# Patient Record
Sex: Female | Born: 1955 | Race: Asian | Hispanic: No | Marital: Single | Smoking: Never smoker
Health system: Southern US, Community
[De-identification: ages and names within clinical notes are randomized; demographics above are authoritative.]

## PROBLEM LIST (undated history)

## (undated) DIAGNOSIS — E7800 Pure hypercholesterolemia, unspecified: Secondary | ICD-10-CM

## (undated) DIAGNOSIS — M810 Age-related osteoporosis without current pathological fracture: Secondary | ICD-10-CM

## (undated) DIAGNOSIS — K056 Periodontal disease, unspecified: Secondary | ICD-10-CM

## (undated) DIAGNOSIS — Z809 Family history of malignant neoplasm, unspecified: Secondary | ICD-10-CM

## (undated) DIAGNOSIS — M7550 Bursitis of unspecified shoulder: Secondary | ICD-10-CM

## (undated) DIAGNOSIS — G43909 Migraine, unspecified, not intractable, without status migrainosus: Secondary | ICD-10-CM

## (undated) DIAGNOSIS — Z803 Family history of malignant neoplasm of breast: Secondary | ICD-10-CM

## (undated) HISTORY — DX: Bursitis of unspecified shoulder: M75.50

## (undated) HISTORY — DX: Age-related osteoporosis without current pathological fracture: M81.0

## (undated) HISTORY — DX: Family history of malignant neoplasm, unspecified: Z80.9

## (undated) HISTORY — DX: Periodontal disease, unspecified: K05.6

## (undated) HISTORY — DX: Family history of malignant neoplasm of breast: Z80.3

## (undated) HISTORY — DX: Pure hypercholesterolemia, unspecified: E78.00

## (undated) HISTORY — DX: Migraine, unspecified, not intractable, without status migrainosus: G43.909

---

## 2015-03-03 ENCOUNTER — Other Ambulatory Visit: Payer: Self-pay | Admitting: Internal Medicine

## 2015-03-03 ENCOUNTER — Encounter: Payer: Self-pay | Admitting: Internal Medicine

## 2015-03-03 DIAGNOSIS — Z1231 Encounter for screening mammogram for malignant neoplasm of breast: Secondary | ICD-10-CM

## 2015-03-04 ENCOUNTER — Telehealth: Payer: Self-pay | Admitting: Obstetrics & Gynecology

## 2015-03-04 NOTE — Telephone Encounter (Signed)
Called and left a message for patient to call back to schedule a new patient doctor referral. °

## 2015-03-22 ENCOUNTER — Encounter (INDEPENDENT_AMBULATORY_CARE_PROVIDER_SITE_OTHER): Payer: Self-pay

## 2015-03-22 ENCOUNTER — Ambulatory Visit
Admission: RE | Admit: 2015-03-22 | Discharge: 2015-03-22 | Disposition: A | Discharge status: Home / Self Care | Payer: 59 | Source: Ambulatory Visit | Attending: Internal Medicine | Admitting: Internal Medicine

## 2015-03-22 DIAGNOSIS — Z1231 Encounter for screening mammogram for malignant neoplasm of breast: Secondary | ICD-10-CM

## 2015-03-24 ENCOUNTER — Ambulatory Visit (INDEPENDENT_AMBULATORY_CARE_PROVIDER_SITE_OTHER): Payer: 59 | Admitting: Obstetrics and Gynecology

## 2015-03-24 ENCOUNTER — Encounter: Payer: Self-pay | Admitting: Obstetrics and Gynecology

## 2015-03-24 VITALS — BP 100/66 | HR 70 | Resp 14 | Ht 64.0 in | Wt 117.0 lb

## 2015-03-24 DIAGNOSIS — Z0000 Encounter for general adult medical examination without abnormal findings: Secondary | ICD-10-CM | POA: Diagnosis not present

## 2015-03-24 DIAGNOSIS — Z01419 Encounter for gynecological examination (general) (routine) without abnormal findings: Secondary | ICD-10-CM

## 2015-03-24 LAB — POCT URINALYSIS DIPSTICK
BILIRUBIN UA: NEGATIVE
Blood, UA: NEGATIVE
GLUCOSE UA: NEGATIVE
KETONES UA: NEGATIVE
LEUKOCYTES UA: NEGATIVE
Nitrite, UA: NEGATIVE
Protein, UA: NEGATIVE
Urobilinogen, UA: NEGATIVE
pH, UA: 5

## 2015-03-24 NOTE — Progress Notes (Signed)
Patient ID: Ashley Estes, female   DOB: 05/30/1956, 59 y.o.   MRN: [id number] 59 y.o. G1P1001 Single Asian female here for annual exam.   No concerns.  Just here for a routine exam.   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  No bleeding or spotting.  No prior HRT. Taking Aledronate for osteoporosis.   Patient is upset about the time she is in the office today.  She did not realize that her information needed to be abstracted and that extra time was required for her first visit.  She did not eat today is is feeling shaky.   PCP: Domenick Gong, MD    Patient's last menstrual period was 11/20/2006 (approximate).          Sexually active: No. female The current method of family planning is post menopausal status.    Exercising: No.  none. Smoker:  no  Health Maintenance: Pap:  2014 wnl:neg HR HPV per patient History of abnormal Pap:  no MMG:  03-22-15 heterogeneously dense/nl:[facility] Colonoscopy:  NEVER.Marland Kitchen Declines.   Did stool card kit in January and it was negative.  BMD:   2013 in [place]  Result  Osteoporosis TDaP:  Within last 5 years Screening Labs:  Hb today: PCP, Urine today: Neg   reports that she has never smoked. She does not have any smokeless tobacco history on file. She reports that she does not drink alcohol or use illicit drugs.  Past Medical History  Diagnosis Date  . Osteoporosis   . Migraines     History reviewed. No pertinent past surgical history.  Current Outpatient Prescriptions  Medication Sig Dispense Refill  . alendronate (FOSAMAX) 70 MG tablet Take 1 tablet by mouth once a week.  0  . aspirin-acetaminophen-caffeine (EXCEDRIN MIGRAINE) 409-811-91 MG per tablet Take 2 tablets by mouth every 6 (six) hours as needed for headache.    . calcium carbonate (OS-CAL) 600 MG TABS tablet Take 600 mg by mouth daily.    . Fish Oil-Cholecalciferol (FISH OIL + D3) 1200-1000 MG-UNIT CAPS Take 1 tablet by mouth daily.    . Multiple  Vitamins-Minerals (MULTIVITAMIN PO) Take 1 tablet by mouth daily.    . rizatriptan (MAXALT) 10 MG tablet Take 1 tablet by mouth as needed.     No current facility-administered medications for this visit.    Family History  Problem Relation Age of Onset  . Cancer Mother     Dec from lung Ca age 51  . Breast cancer Mother 14  . Thyroid disease Mother   . Diabetes Father   . Cancer Father 86    dec from complications of diabetes  . Migraines Sister     ROS:  Pertinent items are noted in HPI.  Otherwise, a comprehensive ROS was negative.  Exam:   BP 100/66 mmHg  Pulse 70  Resp 14  Ht 5' 4" (1.626 m)  Wt 117 lb (53.071 kg)  BMI 20.07 kg/m2  LMP 11/20/2006 (Approximate)    General appearance: alert, cooperative and appears stated age Head: Normocephalic, without obvious abnormality, atraumatic Neck: no adenopathy, supple, symmetrical, trachea midline and thyroid normal to inspection and palpation Lungs: clear to auscultation bilaterally Breasts: normal appearance, no masses or tenderness, Inspection negative, No nipple retraction or dimpling, No nipple discharge or bleeding, No axillary or supraclavicular adenopathy Heart: regular rate and rhythm Abdomen: soft, non-tender; bowel sounds normal; no masses,  no organomegaly Extremities: extremities normal, atraumatic, no cyanosis or edema Skin: Skin color, texture,  turgor normal. No rashes or lesions Lymph nodes: Cervical, supraclavicular, and axillary nodes normal. No abnormal inguinal nodes palpated Neurologic: Grossly normal  Pelvic: External genitalia:  no lesions              Urethra:  normal appearing urethra with no masses, tenderness or lesions              Bartholins and Skenes: normal                 Vagina: normal appearing vagina with normal color and discharge, no lesions              Cervix: no lesions              Pap taken: No. Bimanual Exam:  Uterus:  normal size, contour, position, consistency, mobility,  non-tender              Adnexa: normal adnexa and no mass, fullness, tenderness              Rectovaginal: Yes.  .  Confirms.              Anus:  normal sphincter tone, no lesions  Chaperone was present for exam.  Assessment:   Well woman visit with normal exam. Osteoporosis.  PCP following.  FH of breast cancer in mother.   Plan: Yearly mammogram recommended after age 85.  Discussed 3D.  Reviewed mammogram report with patient from study done this week. Recommended self breast exam.  Pap and HR HPV as above. Would consider pap next year.  Discussed Calcium, Vitamin D. Bone density is due.  Will be coordinated through PCP.  Labs performed.  No..   See orders. Refills given on medications.  No..  See orders. Given Coke.  Follow up annually and prn.      After visit summary provided.

## 2015-03-24 NOTE — Patient Instructions (Signed)

## 2015-05-03 ENCOUNTER — Encounter: Payer: Self-pay | Admitting: Internal Medicine

## 2015-08-12 ENCOUNTER — Ambulatory Visit (INDEPENDENT_AMBULATORY_CARE_PROVIDER_SITE_OTHER): Payer: 59 | Admitting: Obstetrics and Gynecology

## 2015-08-12 ENCOUNTER — Telehealth: Payer: Self-pay | Admitting: Obstetrics and Gynecology

## 2015-08-12 ENCOUNTER — Encounter: Payer: Self-pay | Admitting: Obstetrics and Gynecology

## 2015-08-12 VITALS — BP 124/78 | HR 66 | Ht 64.0 in | Wt 118.0 lb

## 2015-08-12 DIAGNOSIS — N8110 Cystocele, unspecified: Secondary | ICD-10-CM | POA: Diagnosis not present

## 2015-08-12 DIAGNOSIS — N816 Rectocele: Secondary | ICD-10-CM | POA: Diagnosis not present

## 2015-08-12 DIAGNOSIS — IMO0002 Reserved for concepts with insufficient information to code with codable children: Secondary | ICD-10-CM

## 2015-08-12 NOTE — Progress Notes (Signed)
Patient ID: Ashley Estes, female   DOB: 1956-02-23, 59 y.o.   MRN: [id number] GYNECOLOGY  VISIT   HPI: 59 y.o.   Single  Asian  female   G1P1001 with Patient's last menstrual period was 11/20/2006 (approximate).   here for vaginal pressure and feels like something is coming out of vagina.  Patient also complaining of low back pain.  No urinary symptoms.  Felt something vaginally in the shower. Not sure what she is feeling and is worried.   Severe lower back pain every day.   No dysuria.   Voids often.   Cannot stop the stream of urine.   No leak with cough, laugh.   No constipation.  GYNECOLOGIC HISTORY: Patient's last menstrual period was 11/20/2006 (approximate). Contraception:Postmenopausal Menopausal hormone therapy: none Last mammogram: 03-22-15 Density Cat.C/Neg/[facility]Rads 1/[facility] Last pap smear: 2014 Neg:Neg HR HPV per patient        OB History    Gravida Para Term Preterm AB TAB SAB Ectopic Multiple Living   There are no active problems to display for this patient.   Past Medical History  Diagnosis Date  . Osteoporosis   . Migraines   . Hypercholesterolemia     History reviewed. No pertinent past surgical history.  Current Outpatient Prescriptions  Medication Sig Dispense Refill  . alendronate (FOSAMAX) 70 MG tablet Take 1 tablet by mouth once a week.  0  . aspirin-acetaminophen-caffeine (EXCEDRIN MIGRAINE) 250-250-65 MG per tablet Take 2 tablets by mouth every 6 (six) hours as needed for headache.    . calcium carbonate (OS-CAL) 600 MG TABS tablet Take 600 mg by mouth daily.    . Fish Oil-Cholecalciferol (FISH OIL + D3) 1200-1000 MG-UNIT CAPS Take 1 tablet by mouth daily.    . Multiple Vitamins-Minerals (MULTIVITAMIN PO) Take 1 tablet by mouth daily.    . pravastatin (PRAVACHOL) 20 MG tablet Take 20 mg by mouth daily.    . rizatriptan (MAXALT) 10 MG tablet Take 1 tablet by mouth as needed.     No current  facility-administered medications for this visit.     ALLERGIES: Review of patient's allergies indicates no known allergies.  Family History  Problem Relation Age of Onset  . Cancer Mother     Dec from lung Ca age 86  . Breast cancer Mother 22  . Thyroid disease Mother   . Diabetes Father   . Cancer Father 46    dec from complications of diabetes  . Migraines Sister     Social History   Social History  . Marital Status: Single    Spouse Name: N/A  . Number of Children: N/A  . Years of Education: N/A   Occupational History  . Not on file.   Social History Main Topics  . Smoking status: Never Smoker   . Smokeless tobacco: Not on file  . Alcohol Use: No  . Drug Use: No  . Sexual Activity: No   Other Topics Concern  . Not on file   Social History Narrative    ROS:  Pertinent items are noted in HPI.  PHYSICAL EXAMINATION:    BP 124/78 mmHg  Pulse 66  Ht  (1.626 m)  Wt 118 lb (53.524 kg)  BMI 20.24 kg/m2  LMP 11/20/2006 (Approximate)    General appearance: alert, cooperative and appears stated age   Pelvic: External genitalia:  no lesions  Urethra:  normal appearing urethra with no masses, tenderness or lesions              Bartholins and Skenes: normal                 Vagina: normal appearing vagina with normal color and discharge, no lesions.  Almost first degree cystocele and rectocele.  Good uterine support.              Cervix: no lesions             Bimanual Exam:  Uterus:  normal size, contour, position, consistency, mobility, non-tender              Adnexa: normal adnexa and no mass, fullness, tenderness              Rectovaginal: Yes.  .  Confirms.              Anus:  normal sphincter tone, no lesions  Patient also examined in a standing position.   Chaperone was present for exam.  ASSESSMENT  Cystocele and rectocele.  These are both very early stage and I emphasized this to the patient.  Back pain.   PLAN  Discussion  regarding prolapse and incontinence.  ACOG handout on prolapse. Discussed etiologies and treatment options - observation, pelvic floor therapy, pessary, or surgical intervention.  Will do observation at this point.  Avoid straining.  Discussed Kegel exercises.  OK for heat patches for back and NSAIDs prn.  Follow up yearly and prn.   An After Visit Summary was printed and given to the patient.  ___15___ minutes face to face time of which over 50% was spent in counseling

## 2015-08-12 NOTE — Telephone Encounter (Signed)
Spoke with patient. Patient states that for 2 weeks she has been experiencing a lot of vaginal pressure. "It feels like something is pressing against me on the inside." Patient has been performing vaginal examines daily. "I feel like there is a lot of stuff in there." Is also experiencing lower back pain. Denies any current urinary symptoms, fevers, or chills. Requesting an appointment for further evaluation. Appointment scheduled for 9/22 at 2:15 pm with Dr.Silva. Patient is agreeable to date and time.  Routing to provider for final review. Patient agreeable to disposition. Will close encounter.

## 2015-08-12 NOTE — Patient Instructions (Signed)
Please call if you have any worsening of your symptoms!

## 2015-08-12 NOTE — Telephone Encounter (Signed)
Patient thinks she may have a prolapsed uterus. She is feeling a lot of pressure.

## 2016-03-03 ENCOUNTER — Other Ambulatory Visit: Payer: Self-pay

## 2016-03-03 DIAGNOSIS — Z1231 Encounter for screening mammogram for malignant neoplasm of breast: Secondary | ICD-10-CM

## 2016-03-29 ENCOUNTER — Ambulatory Visit
Admission: RE | Admit: 2016-03-29 | Discharge: 2016-03-29 | Disposition: A | Discharge status: Home / Self Care | Payer: 59 | Source: Ambulatory Visit

## 2016-03-29 DIAGNOSIS — Z1231 Encounter for screening mammogram for malignant neoplasm of breast: Secondary | ICD-10-CM

## 2016-04-12 ENCOUNTER — Ambulatory Visit: Payer: 59 | Admitting: Obstetrics and Gynecology

## 2017-02-26 ENCOUNTER — Other Ambulatory Visit: Payer: Self-pay | Admitting: Internal Medicine

## 2017-02-26 DIAGNOSIS — Z1231 Encounter for screening mammogram for malignant neoplasm of breast: Secondary | ICD-10-CM

## 2017-03-30 ENCOUNTER — Ambulatory Visit
Admission: RE | Admit: 2017-03-30 | Discharge: 2017-03-30 | Disposition: A | Discharge status: Home / Self Care | Payer: 59 | Source: Ambulatory Visit | Attending: Internal Medicine | Admitting: Internal Medicine

## 2017-03-30 DIAGNOSIS — Z1231 Encounter for screening mammogram for malignant neoplasm of breast: Secondary | ICD-10-CM

## 2018-02-11 ENCOUNTER — Ambulatory Visit (INDEPENDENT_AMBULATORY_CARE_PROVIDER_SITE_OTHER): Payer: 59 | Admitting: Obstetrics and Gynecology

## 2018-02-11 ENCOUNTER — Encounter: Payer: Self-pay | Admitting: Obstetrics and Gynecology

## 2018-02-11 ENCOUNTER — Other Ambulatory Visit: Payer: Self-pay

## 2018-02-11 ENCOUNTER — Other Ambulatory Visit (HOSPITAL_COMMUNITY)
Admission: RE | Admit: 2018-02-11 | Discharge: 2018-02-11 | Disposition: A | Discharge status: Home / Self Care | Payer: 59 | Source: Ambulatory Visit | Attending: Obstetrics and Gynecology | Admitting: Obstetrics and Gynecology

## 2018-02-11 VITALS — BP 108/62 | HR 68 | Resp 16 | Ht 64.0 in | Wt 119.0 lb

## 2018-02-11 DIAGNOSIS — Z01419 Encounter for gynecological examination (general) (routine) without abnormal findings: Secondary | ICD-10-CM | POA: Diagnosis not present

## 2018-02-11 DIAGNOSIS — Z803 Family history of malignant neoplasm of breast: Secondary | ICD-10-CM | POA: Diagnosis not present

## 2018-02-11 NOTE — Progress Notes (Signed)
62 y.o. G1P1001 Single Asian female here for annual exam.    Denies vaginal bleeding.  Occasional leakage with sneeze.  Occasional use of laxative for constipation.  No splinting.  No real increase in prolapse.   Dealing with bursitis in her shoulders.  Declines STD testing.   Does a lot of volunteering.   PCP: Dr. Wylene Simmer    Patient's last menstrual period was 11/20/2006 (approximate).           Sexually active: No.  The current method of family planning is post menopausal status.    Exercising: Yes.    walking Smoker:  no  Health Maintenance: Pap:  2014 wnl:neg HR HPV per patient History of abnormal Pap:  no MMG:  03/30/17 BIRADS 1 negative/density c Colonoscopy:  Never.  Does stool samples every year with PCP.  She will FU with PCP> BMD:   2018  Result  Per patient done with Dr. Wylene Simmer.  Osteoporosis. TDaP:  unsure Gardasil:   n/a HIV: donates blood Hep C: donates blood Screening Labs:  PCP   reports that she has never smoked. She has never used smokeless tobacco. She reports that she does not drink alcohol or use drugs.  Past Medical History:  Diagnosis Date  . Bursitis of shoulder   . Hypercholesterolemia   . Migraines   . Osteoporosis   . Periodontal disease     History reviewed. No pertinent surgical history.  Current Outpatient Medications  Medication Sig Dispense Refill  . alendronate (FOSAMAX) 70 MG tablet Take 1 tablet by mouth once a week.  0  . aspirin-acetaminophen-caffeine (EXCEDRIN MIGRAINE) 250-250-65 MG per tablet Take 2 tablets by mouth every 6 (six) hours as needed for headache.    . calcium carbonate (OS-CAL) 600 MG TABS tablet Take 600 mg by mouth daily.    . Fish Oil-Cholecalciferol (FISH OIL + D3) 1200-1000 MG-UNIT CAPS Take 1 tablet by mouth daily.    . Multiple Vitamins-Minerals (MULTIVITAMIN PO) Take 1 tablet by mouth daily.    . pravastatin (PRAVACHOL) 20 MG tablet Take 20 mg by mouth daily.    . rizatriptan (MAXALT) 10 MG tablet  Take 1 tablet by mouth as needed.     No current facility-administered medications for this visit.     Family History  Problem Relation Age of Onset  . Cancer Mother        Dec from lung Ca age 38  . Breast cancer Mother 63  . Thyroid disease Mother   . Diabetes Father   . Cancer Father 40       dec from complications of diabetes  . Migraines Sister   . Breast cancer Maternal Aunt     ROS:  Pertinent items are noted in HPI.  Otherwise, a comprehensive ROS was negative.  Exam:   BP 108/62 (BP Location: Right Arm, Patient Position: Sitting, Cuff Size: Normal)   Pulse 68   Resp 16   Ht 5\' 4"  (1.626 m)   Wt 119 lb (54 kg)   LMP 11/20/2006 (Approximate)   BMI 20.43 kg/m     General appearance: alert, cooperative and appears stated age Head: Normocephalic, without obvious abnormality, atraumatic Neck: no adenopathy, supple, symmetrical, trachea midline and thyroid normal to inspection and palpation Lungs: clear to auscultation bilaterally Breasts: normal appearance, no masses or tenderness, No nipple retraction or dimpling, No nipple discharge or bleeding, No axillary or supraclavicular adenopathy Heart: regular rate and rhythm Abdomen: soft, non-tender; no masses, no organomegaly Extremities: extremities normal,  atraumatic, no cyanosis or edema Skin: Skin color, texture, turgor normal. No rashes or lesions Lymph nodes: Cervical, supraclavicular, and axillary nodes normal. No abnormal inguinal nodes palpated Neurologic: Grossly normal  Pelvic: External genitalia:  no lesions              Urethra:  normal appearing urethra with no masses, tenderness or lesions              Bartholins and Skenes: normal                 Vagina: normal appearing vagina with normal color and discharge, no lesions              Cervix: no lesions              Pap taken: Yes.   Bimanual Exam:  Uterus:  normal size, contour, position, consistency, mobility, non-tender              Adnexa: no mass,  fullness, tenderness              Rectal exam: Yes.  .  Confirms.              Anus:  normal sphincter tone, no lesions  Chaperone was present for exam.  Assessment:   Well woman visit with normal exam. FH breast cancer.  Premenopausal.  Osteoporosis.  On Fosamax.   Plan: Mammogram screening discussed.   I recommended 3D. Recommended self breast awareness. Pap and HR HPV as above. Guidelines for Calcium, Vitamin D, regular exercise program including cardiovascular and weight bearing exercise. Referral for genetic testing.  I discussed possible yearly MRI of the breat.  Fu annually and prn.   After visit summary provided.

## 2018-02-11 NOTE — Patient Instructions (Signed)

## 2018-02-12 LAB — CYTOLOGY - PAP
Diagnosis: NEGATIVE
HPV: NOT DETECTED

## 2018-02-13 ENCOUNTER — Encounter: Payer: Self-pay | Admitting: Genetic Counselor

## 2018-02-13 ENCOUNTER — Telehealth: Payer: Self-pay | Admitting: Genetic Counselor

## 2018-02-13 NOTE — Telephone Encounter (Signed)
A genetic counseling appt has been scheduled for the pt to see Ashley Estes on 5/8 at 2pm. Pt aware to arrive 30 minutes early. Letter mailed.

## 2018-03-27 ENCOUNTER — Encounter: Payer: Self-pay | Admitting: Genetic Counselor

## 2018-03-27 ENCOUNTER — Inpatient Hospital Stay: Payer: 59 | Attending: Genetic Counselor | Admitting: Genetic Counselor

## 2018-03-27 ENCOUNTER — Inpatient Hospital Stay: Payer: 59

## 2018-03-27 DIAGNOSIS — Z809 Family history of malignant neoplasm, unspecified: Secondary | ICD-10-CM

## 2018-03-27 DIAGNOSIS — Z803 Family history of malignant neoplasm of breast: Secondary | ICD-10-CM | POA: Insufficient documentation

## 2018-03-27 DIAGNOSIS — Z1379 Encounter for other screening for genetic and chromosomal anomalies: Secondary | ICD-10-CM

## 2018-03-27 NOTE — Progress Notes (Signed)
REFERRING PROVIDER: Nunzio Cobbs, MD [address] [place], [place] [address]  PRIMARY PROVIDER:  Tisovec, Fransico Him, MD  PRIMARY REASON FOR VISIT:  1. Family history of breast cancer   2. Family history of cancer      HISTORY OF PRESENT ILLNESS:   Ms. Legros, a 62 y.o. female, was seen for a [facility] cancer genetics consultation at the request of Dr. Yisroel Ramming due to a family history of cancer.  Ms. Mogensen presents to clinic today to discuss the possibility of a hereditary predisposition to cancer, genetic testing, and to further clarify her future cancer risks, as well as potential cancer risks for family members.   Ms. Dicker is a 62 y.o. female with no personal history of cancer.    CANCER HISTORY:   No history exists.     HORMONAL RISK FACTORS:  Menarche was at age 62.  First live birth at age 55.  OCP use for approximately 10 years.  Ovaries intact: yes.  Hysterectomy: no.  Menopausal status: postmenopausal.  HRT use: 0 years. Colonoscopy: no; not examined. Mammogram within the last year: yes. Number of breast biopsies: 0. Up to date with pelvic exams:  yes. Any excessive radiation exposure in the past:  no  Past Medical History:  Diagnosis Date  . Bursitis of shoulder   . Family history of breast cancer   . Family history of cancer   . Hypercholesterolemia   . Migraines   . Osteoporosis   . Periodontal disease     No past surgical history on file.  Social History   Socioeconomic History  . Marital status: Single    Spouse name: Not on file  . Number of children: Not on file  . Years of education: Not on file  . Highest education level: Not on file  Occupational History  . Not on file  Social Needs  . Financial resource strain: Not on file  . Food insecurity:    Worry: Not on file    Inability: Not on file  . Transportation needs:    Medical: Not on file    Non-medical: Not on file  Tobacco Use  . Smoking status:  Never Smoker  . Smokeless tobacco: Never Used  Substance and Sexual Activity  . Alcohol use: No    Alcohol/week: 0.0 oz  . Drug use: No  . Sexual activity: Not Currently    Partners: Male    Birth control/protection: Post-menopausal  Lifestyle  . Physical activity:    Days per week: Not on file    Minutes per session: Not on file  . Stress: Not on file  Relationships  . Social connections:    Talks on phone: Not on file    Gets together: Not on file    Attends religious service: Not on file    Active member of club or organization: Not on file    Attends meetings of clubs or organizations: Not on file    Relationship status: Not on file  Other Topics Concern  . Not on file  Social History Narrative  . Not on file     FAMILY HISTORY:  We obtained a detailed, 4-generation family history.  Significant diagnoses are listed below: Family History  Problem Relation Age of Onset  . Cancer Mother        Dec from lung Ca age 41  . Breast cancer Mother 37  . Thyroid disease Mother   . Ovarian  cancer Mother        carcinoma in situ found on TAH-BAO at 47  . Diabetes Father        d. from complications of diabetes at 22  . Migraines Sister   . Breast cancer Maternal Aunt        d. 59  . Dementia Paternal Aunt   . Other Paternal Uncle        both uncles died in their 55s  . Dementia Maternal Grandmother   . Other Paternal Grandmother        d. in childbirth  . Other Paternal Grandfather        d. of ear infection  . Breast cancer Other        MGMs sister    The patient has one daughter who is cancer free.  She has two sisters and a brother who are cancer free.  Both parents are deceased.  The patient's mother developed breast cancer at 49.  She had a complete hysterectomy that found on pathology carcinoma in situ.  It is unknown if the cancer was in the uterus, fallopian tubes or ovaries.  The patient's mother had one sister who also had breast cancer and died at 28.  Both  maternal grandparents are deceased.  The grandmother had dementia.  She also had a sister who had breast cancer.  The patient's father died from complications of diabetes.  He had two brothers who died in their 23's and two sisters, one who died at 16 and the other who died from surgery.  Both grandparents are deceased.  The grandmother died in childbirth and the grandfather died from an ear infection.  Ms. Yamaguchi is unaware of previous family history of genetic testing for hereditary cancer risks. Patient's maternal ancestors are of [place], [place], [place] and [place] descent, and paternal ancestors are of [place] descent. There is no reported Ashkenazi Jewish ancestry. There is no known consanguinity.  GENETIC COUNSELING ASSESSMENT: JONIQUA SIDLE is a 62 y.o. female with a family history of breast cancer which is somewhat suggestive of a hereditary cancer syndrome and predisposition to cancer. We, therefore, discussed and recommended the following at today's visit.   DISCUSSION: We discussed that about 5-10% of breast cancer is hereditary with most cases due to BRCA mutations.  Other genes that can increase the risk for breast cancer include ATM, CHEK2 and PALB2. We reviewed the characteristics, features and inheritance patterns of hereditary cancer syndromes. We also discussed genetic testing, including the appropriate family members to test, the process of testing, insurance coverage and turn-around-time for results. If Ms. Gibbs is tested, her result may or may not be informative for her family.  If she is negative, her siblings will still need to undergo genetic testing in order to determine if there is a hereditary cause to the cancer in the family.  If she is positive, we have then identified the cause of cancer in the family and can offer testing to others.  We discussed the implications of a negative, positive and/or variant of uncertain significant result. We recommended Ms. Alldredge pursue genetic testing  for the common hereditary cancer gene panel. The Hereditary Gene Panel offered by [facility] includes sequencing and/or deletion duplication testing of the following 47 genes: APC, ATM, AXIN2, BARD1, BMPR1A, BRCA1, BRCA2, BRIP1, CDH1, CDK4, CDKN2A (p14ARF), CDKN2A (p16INK4a), CHEK2, CTNNA1, DICER1, EPCAM (Deletion/duplication testing only), GREM1 (promoter region deletion/duplication testing only), KIT, MEN1, MLH1, MSH2, MSH3, MSH6, MUTYH, NBN, NF1, NHTL1, PALB2, PDGFRA,  PMS2, POLD1, POLE, PTEN, RAD50, RAD51C, RAD51D, SDHB, SDHC, SDHD, SMAD4, SMARCA4. STK11, TP53, TSC1, TSC2, and VHL.  The following genes were evaluated for sequence changes only: SDHA and HOXB13 c.251G>A variant only.   Based on Ms. Dombek's family history of cancer, she meets medical criteria for genetic testing. Despite that she meets criteria, she may still have an out of pocket cost. We discussed that if her out of pocket cost for testing is over $100, the laboratory will call and confirm whether she wants to proceed with testing.  If the out of pocket cost of testing is less than $100 she will be billed by the genetic testing laboratory.   In order to estimate her chance of having a BRCA mutation, we used statistical models ([employer]) and laboratory data that take into account her personal medical history, family history and ancestry.  Because each model is different, there can be a lot of variability in the risks they give.  Therefore, these numbers must be considered a rough range and not a precise risk of having a BRCA mutation.  These models estimate that she has approximately a 5-10% chance of having a mutation. Based on this assessment of her family and personal history, genetic testing is recommended. We calculated her risk twice - once including her mother has having ovarian cancer and the second time calculating it as if the carcinoma in situ was not ovarian in nature.  Both are below:     Based on the patient's  personal and family history, statistical models (Tyrer Cusik)  and literature data were used to estimate her risk of developing breast cancer. These estimate her lifetime risk of developing breast cancer to be approximately 13.5%. This estimation does not take into account any genetic testing results.  The patient's lifetime breast cancer risk is a preliminary estimate based on available information using one of several models endorsed by the [place] (ACS). The ACS recommends consideration of breast MRI screening as an adjunct to mammography for patients at high risk (defined as 20% or greater lifetime risk). A more detailed breast cancer risk assessment can be considered, if clinically indicated.     PLAN: Despite our recommendation, Ms. Macfadden did not wish to pursue genetic testing at today's visit. She felt reassured by the numbers, and voiced her understanding that there could be other factors that we could not predict.  She understands that she could be positive, but feels that she is OK with that risk.  She plans to discuss this further with her sisters.  One sister is a GYN physician, and she plans to ask her why she has not undergone genetic testing. We understand Ms. Yarde decision, and remain available to coordinate genetic testing at any time in the future. We, therefore, recommend Ms. Pettus continue to follow the cancer screening guidelines given by her primary healthcare provider.  Based on Ms. West's family history, we recommended her siblings have genetic counseling and testing. Ms. Metzger will let us know if we can be of any assistance in coordinating genetic counseling and/or testing for this family member.   Lastly, we encouraged Ms. Younan to remain in contact with cancer genetics annually so that we can continuously update the family history and inform her of any changes in cancer genetics and testing that may be of benefit for this family.   Ms.  Onley questions were answered to  her satisfaction today. Our contact information was provided should additional questions  or concerns arise. Thank you for the referral and allowing [place] to share in the care of your patient.   Sedalia Greeson P. Florene Glen, [place], [facility] Certified Genetic Counselor [email or web address] phone: [phone]  The patient was seen for a total of 45 minutes in face-to-face genetic counseling.  This patient was discussed with Drs. Magrinat, Lindi Adie and/or Burr Medico who agrees with the above.    _______________________________________________________________________ For Office Staff:  Number of people involved in session: 1 Was an Intern/ student involved with case: no

## 2018-04-12 ENCOUNTER — Encounter: Payer: Self-pay | Admitting: Internal Medicine

## 2018-05-14 ENCOUNTER — Other Ambulatory Visit: Payer: Self-pay | Admitting: Internal Medicine

## 2018-05-14 DIAGNOSIS — Z1231 Encounter for screening mammogram for malignant neoplasm of breast: Secondary | ICD-10-CM

## 2018-06-26 ENCOUNTER — Ambulatory Visit
Admission: RE | Admit: 2018-06-26 | Discharge: 2018-06-26 | Disposition: A | Discharge status: Home / Self Care | Payer: 59 | Source: Ambulatory Visit | Attending: Internal Medicine | Admitting: Internal Medicine

## 2018-06-26 DIAGNOSIS — Z1231 Encounter for screening mammogram for malignant neoplasm of breast: Secondary | ICD-10-CM

## 2020-03-10 ENCOUNTER — Other Ambulatory Visit: Payer: Self-pay | Admitting: Obstetrics and Gynecology

## 2020-03-10 DIAGNOSIS — Z1231 Encounter for screening mammogram for malignant neoplasm of breast: Secondary | ICD-10-CM

## 2020-04-20 ENCOUNTER — Ambulatory Visit
Admission: RE | Admit: 2020-04-20 | Discharge: 2020-04-20 | Disposition: A | Discharge status: Home / Self Care | Payer: 59 | Source: Ambulatory Visit | Attending: Obstetrics and Gynecology | Admitting: Obstetrics and Gynecology

## 2020-04-20 ENCOUNTER — Other Ambulatory Visit: Payer: Self-pay

## 2020-04-20 DIAGNOSIS — Z1231 Encounter for screening mammogram for malignant neoplasm of breast: Secondary | ICD-10-CM

## 2020-04-20 IMAGING — MG DIGITAL SCREENING BILAT W/ CAD
4 series · 4 of 4 positions shown · non-contrast
Comparison: Previous exam(s).

CLINICAL DATA: Screening.

EXAM:
DIGITAL SCREENING BILATERAL MAMMOGRAM WITH CAD

[L MLO]
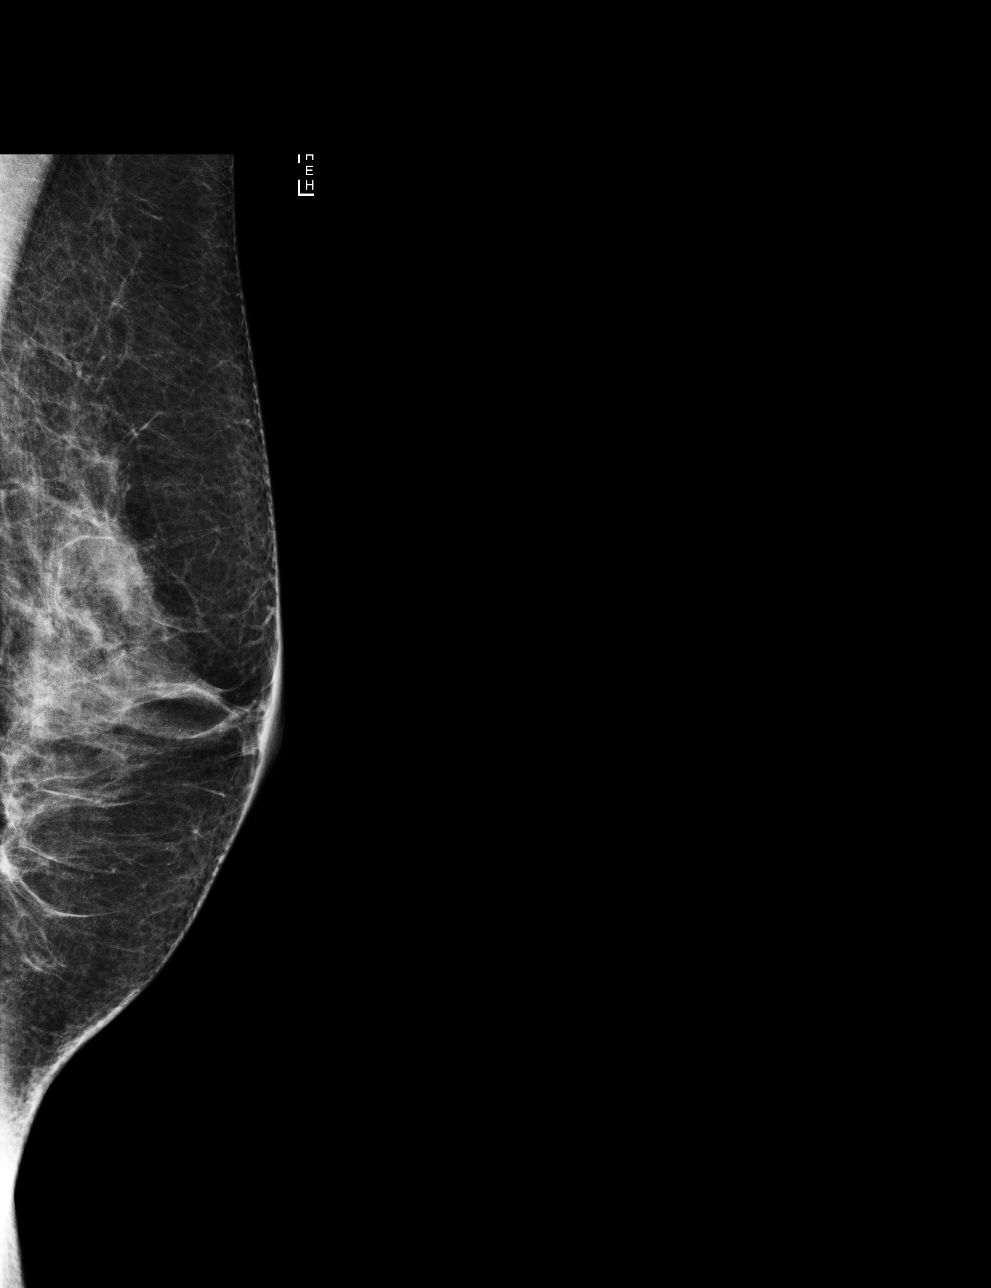

[R MLO]
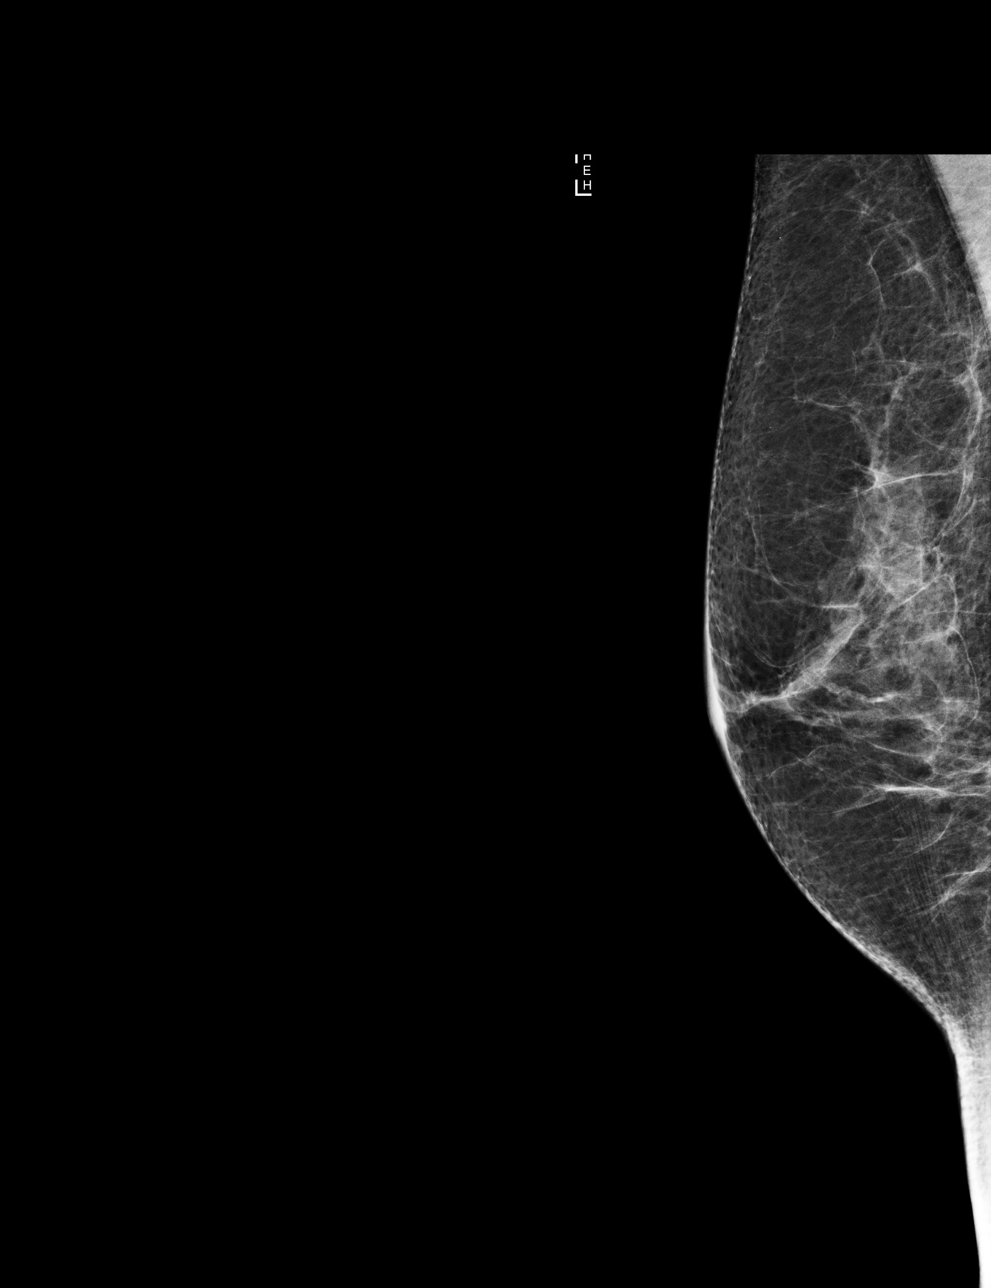

[R CC]
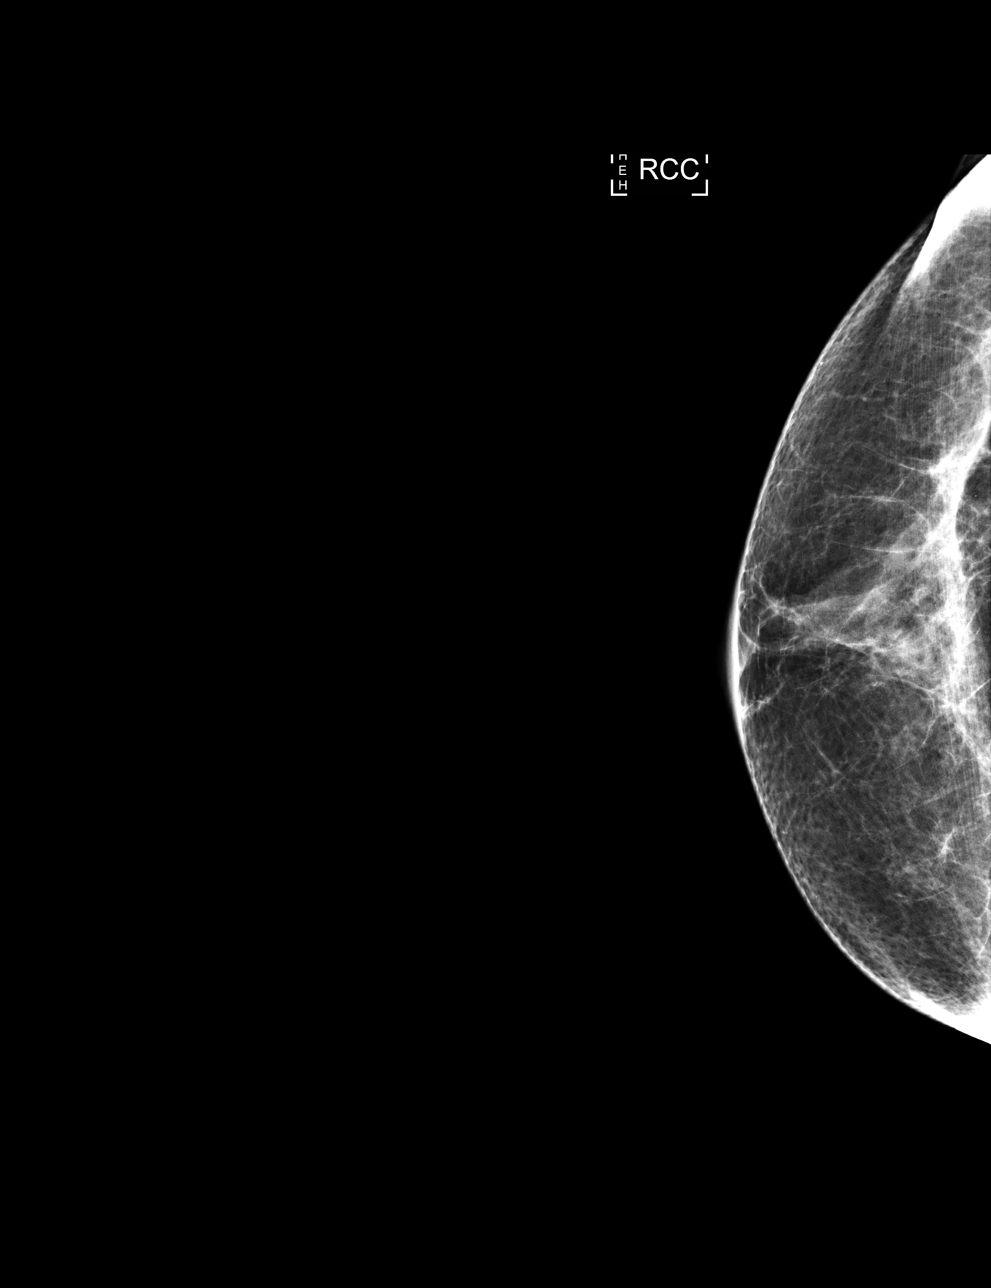

[L CC]
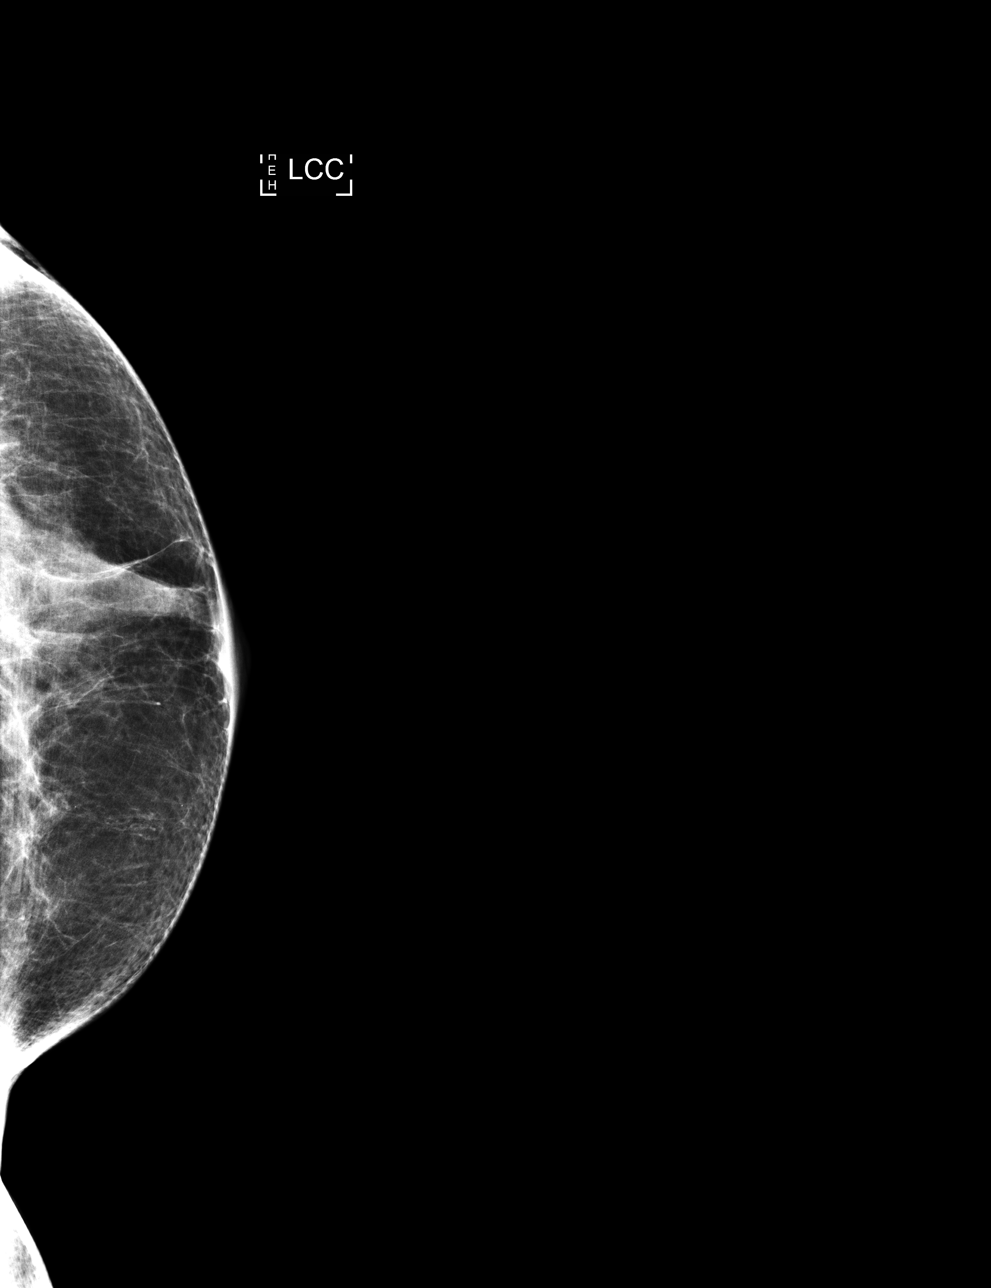

[4 of 4 positions shown; findings below may reference images not displayed]

ACR Breast Density Category c: The breast tissue is heterogeneously
dense, which may obscure small masses.
FINDINGS: There are no findings suspicious for malignancy. Images were
processed with CAD.
IMPRESSION: No mammographic evidence of malignancy. A result letter of this
screening mammogram will be mailed directly to the patient.

RECOMMENDATION:
Screening mammogram in one year. (Code:[0J])

BI-RADS CATEGORY  1: Negative.

## 2020-05-27 ENCOUNTER — Encounter: Payer: Self-pay | Admitting: Neurology

## 2020-05-27 ENCOUNTER — Ambulatory Visit (INDEPENDENT_AMBULATORY_CARE_PROVIDER_SITE_OTHER): Payer: 59 | Admitting: Neurology

## 2020-05-27 ENCOUNTER — Other Ambulatory Visit: Payer: Self-pay

## 2020-05-27 VITALS — BP 118/69 | HR 101 | Ht 64.0 in | Wt 119.0 lb

## 2020-05-27 DIAGNOSIS — R0683 Snoring: Secondary | ICD-10-CM

## 2020-05-27 DIAGNOSIS — M2619 Other specified anomalies of jaw-cranial base relationship: Secondary | ICD-10-CM

## 2020-05-27 DIAGNOSIS — G478 Other sleep disorders: Secondary | ICD-10-CM

## 2020-05-27 DIAGNOSIS — R5383 Other fatigue: Secondary | ICD-10-CM | POA: Diagnosis not present

## 2020-05-27 NOTE — Progress Notes (Signed)
SLEEP MEDICINE CLINIC    Provider:  Melvyn Novas, MD  Primary Care Physician:  Gaspar Garbe, MD [address] [place] [address]     Referring Provider: Gaspar Garbe, Md [address] [place],  [place] [address]          Chief Complaint according to patient   Patient presents with:    . New Patient (Initial Visit)     for several years she has been known to snore loudly and wake up with frequent headaches. -she has became increasingly fatigued during the daytime to a point where she is having to take naps, daily at 2 Pm.       HISTORY OF PRESENT ILLNESS:  Ashley Estes is a 64 y.o. year old Asian female patient and seen here upon  a referral on 05/27/2020 from PCP, Dr Wylene Simmer. Chief concern according to patient : Ashley Estes reports that she knows that she is snoring for quite a while, her siblings and even children have mentioned it but she was on vacation with them.  She has become increasingly fatigued perhaps over the last year and month, she has volunteered less than she used to before she is retired she does not have the same time constraints and this allows her to take a nap every day but she is worried because she never needed to take a daily nap before now.  She has tried all kinds of over-the-counter approaches to improve her snoring, trying to sleep reclined sitting up, taping her mouth so that she would not have oral airflow, the slide strips nasal prongs, and also a device that sucks the tongue into the front of the mouth preventing it from falling back.  All of these attempts have left her with horrible sleep quality. She noticed fatigue and sleepiness and has never had a previous sleep study.   I have the pleasure of seeing Ashley Estes today, a right -handed Asian- Caucasian  female with a possible sleep disorder. 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   She has a past medical history of Bursitis of  shoulder, Family history of breast cancer, Family history of cancer, Hypercholesterolemia, Migraines, Osteoporosis, and Periodontal disease.    Sleep relevant medical history: Nocturia some nights only , Snoring. No Tonsillectomy.   Family medical /sleep history: parents were loud snorers, two of her siblings are loud snorers.    Social history:  Patient is retired from Recruitment consultant position, did a lot of business travel- and had no trouble sleeping.  She  lives in a household alone. Tobacco use; none.  ETOH use: none , Caffeine intake in form of Coffee( 1 cup ) Soda( /) Tea ( /) or energy drinks. Regular exercise ; none    Hobbies :volunteering, reading .      Sleep habits are as follows: The patient's dinner time is between 2 PM. The patient goes to bed at 10-12 PM and continues to sleep for 5-7 hours, she has trouble to go to sleep if she has to get up early the next day.   The preferred sleep position is supine , with the support of 1-2 pillows. Dreams are reportedly rare.  7.30 AM is the usual rise time. The patient wakes up spontaneously.  She reports  feeling refreshed / restored in AM, with symptoms such as morning headaches. Naps are taken frequently, lasting from 20-30 minutes and are refreshing than nocturnal sleep.  Review of Systems: Out of a complete 14 system review, the patient complains of only the following symptoms, and all other reviewed systems are negative.:  Fatigue, sleepiness , snoring, fragmented sleep, Insomnia - with some anxiety.  Some disinterest in activities that used to give her pleasure with a feeling of decreased energy requiring a daily nap, constipation, moles, fatigue, snoring, easy bruising, morning headaches.  She also is prone to migraines and has a diagnosis of osteoporosis.   How likely are you to doze in the following situations: 0 = not likely, 1 = slight chance, 2 = moderate chance, 3 = high chance   Sitting and Reading? 1 Watching Television?  1 Sitting inactive in a public place (theater or meeting)0 As a passenger in a car for an hour without a break? Lying down in the afternoon when circumstances permit? #3 Sitting and talking to someone? Sitting quietly after lunch without alcohol? In a car, while stopped for a few minutes in traffic?   Total = 7/ 24 points   FSS endorsed at 27/ 63 points.   Social History   Socioeconomic History  . Marital status: Single    Spouse name: Not on file  . Number of children: Not on file  . Years of education: Not on file  . Highest education level: Not on file  Occupational History  . Not on file  Tobacco Use  . Smoking status: Never Smoker  . Smokeless tobacco: Never Used  Vaping Use  . Vaping Use: Never used  Substance and Sexual Activity  . Alcohol use: No    Alcohol/week: 0.0 standard drinks  . Drug use: No  . Sexual activity: Not Currently    Partners: Male    Birth control/protection: Post-menopausal  Other Topics Concern  . Not on file  Social History Narrative  . Not on file   Social Determinants of Health   Financial Resource Strain:   . Difficulty of Paying Living Expenses:   Food Insecurity:   . Worried About Programme researcher, broadcasting/film/video in the Last Year:   . Barista in the Last Year:   Transportation Needs:   . Freight forwarder (Medical):   Marland Kitchen Lack of Transportation (Non-Medical):   Physical Activity:   . Days of Exercise per Week:   . Minutes of Exercise per Session:   Stress:   . Feeling of Stress :   Social Connections:   . Frequency of Communication with Friends and Family:   . Frequency of Social Gatherings with Friends and Family:   . Attends Religious Services:   . Active Member of Clubs or Organizations:   . Attends Banker Meetings:   Marland Kitchen Marital Status:     Family History  Problem Relation Age of Onset  . Cancer Mother        Dec from lung Ca age 20  . Breast cancer Mother 22  . Thyroid disease Mother   . Ovarian  cancer Mother        carcinoma in situ found on TAH-BAO at 67  . Diabetes Father        d. from complications of diabetes at 41  . Migraines Sister   . Breast cancer Maternal Aunt        d. 1  . Dementia Paternal Aunt   . Other Paternal Uncle        both uncles died in their 82s  . Dementia Maternal Grandmother   . Other Paternal  Grandmother        d. in childbirth  . Other Paternal Grandfather        d. of ear infection  . Breast cancer Other        MGMs sister    Past Medical History:  Diagnosis Date  . Bursitis of shoulder   . Family history of breast cancer   . Family history of cancer   . Hypercholesterolemia   . Migraines   . Osteoporosis   . Periodontal disease     No past surgical history on file.   Current Outpatient Medications on File Prior to Visit  Medication Sig Dispense Refill  . alendronate (FOSAMAX) 70 MG tablet Take 1 tablet by mouth once a week.  0  . aspirin-acetaminophen-caffeine (EXCEDRIN MIGRAINE) 250-250-65 MG per tablet Take 2 tablets by mouth every 6 (six) hours as needed for headache.    . calcium carbonate (OS-CAL) 600 MG TABS tablet Take 600 mg by mouth daily.    . Fish Oil-Cholecalciferol (FISH OIL + D3) 1200-1000 MG-UNIT CAPS Take 1 tablet by mouth daily.    . Multiple Vitamins-Minerals (MULTIVITAMIN PO) Take 1 tablet by mouth daily.    . pravastatin (PRAVACHOL) 20 MG tablet Take 20 mg by mouth daily.    . rizatriptan (MAXALT) 10 MG tablet Take 1 tablet by mouth as needed.     No current facility-administered medications on file prior to visit.   Physical exam:  Today's Vitals   05/27/20 0848  BP: 118/69  Pulse: (!) 101  Weight: 119 lb (54 kg)  Height: 5\' 4"  (1.626 m)   Body mass index is 20.43 kg/m.   Wt Readings from Last 3 Encounters:  05/27/20 119 lb (54 kg)  02/11/18 119 lb (54 kg)  08/12/15 118 lb (53.5 kg)     Ht Readings from Last 3 Encounters:  05/27/20 5\' 4"  (1.626 m)  02/11/18 5\' 4"  (1.626 m)  08/12/15 5\' 4"   (1.626 m)      General: The patient is awake, alert and appears not in acute distress. The patient is well groomed. Head: Normocephalic, atraumatic.  Neck is supple. Mallampati 2,  neck circumference: 13 inches . Nasal airflow patent.  Retrognathia is  seen.  Dental status: intact Cardiovascular:  Regular rate and cardiac rhythm by pulse,  without distended neck veins. Respiratory: Lungs are clear to auscultation.  Skin:  Without evidence of ankle edema, or rash. Trunk: The patient's posture is erect.   Neurologic exam : The patient is awake and alert, oriented to place and time.   Memory subjective described as intact.  Attention span & concentration ability appears normal.  Speech is fluent,  without  dysarthria, dysphonia or aphasia.  Mood and affect are appropriate.   Cranial nerves: no loss of smell or taste reported  Pupils are equal and briskly reactive to light. Funduscopic exam .  Extraocular movements in vertical and horizontal planes were intact and without nystagmus. No Diplopia. Visual fields by finger perimetry are intact. Hearing was intact to soft voice and finger rubbing.    Facial sensation intact to fine touch.  Facial motor strength is symmetric and tongue and uvula move midline.  Neck ROM : rotation, tilt and flexion extension were normal for age and shoulder shrug was symmetrical.    Motor exam:  Symmetric bulk, tone and ROM.   Normal tone without cog- wheeling, symmetric grip strength . Sensory:    Coordination: Rapid alternating movements in the fingers/hands were of normal  speed.  The Finger-to-nose maneuver was intact without evidence of ataxia, dysmetria or tremor.   Gait and station: Patient could rise unassisted from a seated position, walked without assistive device.  Stance is of normal width/ base and the patient turned with 3 steps.  Toe and heel walk were deferred.  Deep tendon reflexes: in the  upper and lower extremities are symmetric and  intact.  Babinski response was deferred.      After spending a total time of 40 minutes face to face and additional time for physical and neurologic examination, review of laboratory studies,  personal review of imaging studies, reports and results of other testing and review of referral information / records as far as provided in visit, I have established the following assessments:  1) based on Dr. Deneen Harts office notes I cannot really state that Ashley Estes is a very healthy individual 64 years old is a BMI of 20.4, normal blood pressure, the only upper airway abnormality is retrognathia. Based on her report that she sometimes has trouble initiating sleep when she knows she has to get up early the next morning but it is not a constant struggle with insomnia at all. The loud snoring has been witnessed for years and in the past has not bothered her much but I do think that her feeling of fatigue and not getting enough restorative sleep the need for a power nap after lunch and the morning headaches can be related to untreated sleep apnea.  For this reason I will order a home sleep test for the patient which will allow me to screen for apnea hypoxia and tachybradycardia.  If this is positive for snoring which would be reflected in elevated so-called respiratory disturbance index, I would either suggest treatment for apnea or treatment by a dental procedure or dental device for mainly snoring.  Given that she has tried so many over-the-counter treatments with little success I think it is best to have a made to measure dental device should snoring outweigh the  risk of apnea.    My Plan is to proceed with:  1) For this reason I will order a home sleep test for the patient which will allow me to screen for apnea hypoxia and tachybradycardia.  If this is positive for snoring which would be reflected in elevated so-called respiratory disturbance index, I would either suggest treatment for apnea or treatment by  a dental procedure or dental device for mainly snoring.  Given that she has tried so many over-the-counter treatments with little success I think it is best to have a made to measure dental device should snoring outweigh the  risk of apnea.   I would like to thank  Gaspar Garbe, Md [address] [place],  [place] [address] for allowing me to meet with and to take care of this pleasant patient.   I plan to follow up either personally or through our NP within 3 month.   CC: I will share my notes with PCP.   Electronically signed by: Melvyn Novas, MD 05/27/2020 9:03 AM  [facility] Board certified by The [employer] of Sleep Medicine and Diplomate of the [employer] of Sleep Medicine. Board certified In Neurology through the ABPN, Fellow of the [employer] of Neurology. Medical Director of [facility].

## 2020-05-27 NOTE — Patient Instructions (Signed)

## 2020-06-01 NOTE — Progress Notes (Signed)
64 y.o. G1P1001 Single Asian female here for annual exam.    No vaginal bleeding or spotting.   Has had COVID vaccines--[employer].  PCP: Guerry Bruin, MD    Patient's last menstrual period was 11/20/2006 (approximate).           Sexually active: No.  The current method of family planning is post menopausal status.    Exercising: No.  The patient does not participate in regular exercise at present. Smoker:  no  Health Maintenance: Pap: 02-01-18 Neg:Neg HR HPV, 2014 wnl:neg HR HPV per patient History of abnormal Pap:  no MMG: 04-20-20 Neg/density C/BiRads1 Colonoscopy:  Never.  Does stool samples every year with PCP BMD: 2021  Result :with Dr.Tisovec--took off Fosamax after being on for 7 - 8 years.  TDaP: PCP Gardasil:   no HIV: donates blood Hep C:donates blood Screening Labs:  PCP.  Has not done Shingrix.  She is not certain if she had chicken pox as a child.    reports that she has never smoked. She has never used smokeless tobacco. She reports that she does not drink alcohol and does not use drugs.  Past Medical History:  Diagnosis Date  . Bursitis of shoulder   . Family history of breast cancer   . Family history of cancer   . Hypercholesterolemia   . Migraines   . Osteoporosis   . Periodontal disease     History reviewed. No pertinent surgical history.  Current Outpatient Medications  Medication Sig Dispense Refill  . aspirin-acetaminophen-caffeine (EXCEDRIN MIGRAINE) 250-250-65 MG per tablet Take 2 tablets by mouth every 6 (six) hours as needed for headache.    . calcium carbonate (OS-CAL) 600 MG TABS tablet Take 600 mg by mouth daily.    . Multiple Vitamins-Minerals (MULTIVITAMIN PO) Take 1 tablet by mouth daily.    . pravastatin (PRAVACHOL) 20 MG tablet Take 20 mg by mouth daily.    . rizatriptan (MAXALT) 10 MG tablet Take 1 tablet by mouth as needed.     No current facility-administered medications for this visit.    Family History  Problem Relation Age  of Onset  . Cancer Mother        Dec from lung Ca age 52  . Breast cancer Mother 17  . Thyroid disease Mother   . Ovarian cancer Mother        carcinoma in situ found on TAH-BAO at 39  . Diabetes Father        d. from complications of diabetes at 53  . Cancer Father        lung cancer - metastatic  . Migraines Sister   . Cancer Sister        skin cancer around the anus  . Breast cancer Maternal Aunt        d. 51  . Dementia Paternal Aunt   . Other Paternal Uncle        both uncles died in their 72s  . Dementia Maternal Grandmother   . Other Paternal Grandmother        d. in childbirth  . Other Paternal Grandfather        d. of ear infection  . Breast cancer Other        MGMs sister    Review of Systems  All other systems reviewed and are negative.   Exam:   BP 134/72   Pulse 60   Resp 16   Ht 5\' 4"  (1.626 m)   Wt 119 lb  12.8 oz (54.3 kg)   LMP 11/20/2006 (Approximate)   BMI 20.56 kg/m     General appearance: alert, cooperative and appears stated age Head: normocephalic, without obvious abnormality, atraumatic Neck: no adenopathy, supple, symmetrical, trachea midline and thyroid normal to inspection and palpation Lungs: clear to auscultation bilaterally Breasts: normal appearance, no masses or tenderness, No nipple retraction or dimpling, No nipple discharge or bleeding, No axillary adenopathy Heart: regular rate and rhythm Abdomen: soft, non-tender; no masses, no organomegaly Extremities: extremities normal, atraumatic, no cyanosis or edema Skin: skin color, texture, turgor normal. No rashes or lesions Lymph nodes: cervical, supraclavicular, and axillary nodes normal. Neurologic: grossly normal  Pelvic: External genitalia:  no lesions              No abnormal inguinal nodes palpated.              Urethra:  normal appearing urethra with no masses, tenderness or lesions              Bartholins and Skenes: normal                 Vagina: normal appearing vagina  with normal color and discharge, no lesions              Cervix: no lesions              Pap taken: No. Bimanual Exam:  Uterus:  normal size, contour, position, consistency, mobility, non-tender              Adnexa: no mass, fullness, tenderness              Rectal exam: Yes.  .  Confirms.              Anus:  normal sphincter tone, no lesions  Chaperone was present for exam.  Assessment:   Well woman visit with normal exam. FH breast cancer.  Premenopausal.  Osteoporosis.  On Fosamax.  Plan: Mammogram screening discussed. Self breast awareness reviewed. Pap and HR HPV 2024.  New guidelines discussed.  Guidelines for Calcium, Vitamin D, regular exercise program including cardiovascular and weight bearing exercise. We discussed Shingrix vaccine.  Will get a copy of her BMD. Follow up annually and prn.   After visit summary provided.

## 2020-06-02 ENCOUNTER — Telehealth: Payer: Self-pay

## 2020-06-02 NOTE — Telephone Encounter (Signed)
Voicemail message, 3:50p:   Patient wondering if she is waiting for Dr. Vickey Huger to return to have her sleep study done or is it being mailed to her?

## 2020-06-03 ENCOUNTER — Encounter: Payer: Self-pay | Admitting: Obstetrics and Gynecology

## 2020-06-03 ENCOUNTER — Other Ambulatory Visit: Payer: Self-pay

## 2020-06-03 ENCOUNTER — Ambulatory Visit (INDEPENDENT_AMBULATORY_CARE_PROVIDER_SITE_OTHER): Payer: 59 | Admitting: Obstetrics and Gynecology

## 2020-06-03 VITALS — BP 134/72 | HR 60 | Resp 16 | Ht 64.0 in | Wt 119.8 lb

## 2020-06-03 DIAGNOSIS — Z01419 Encounter for gynecological examination (general) (routine) without abnormal findings: Secondary | ICD-10-CM

## 2020-06-03 NOTE — Telephone Encounter (Signed)
Called the patient and advised that it can take 10-14 business days from the time the order was placed to get insurance approval to complete the HST. Advised once we get insurance approval our sleep lab will be in contact to get her scheduled to come and pick up the machine. Pt verbalized understanding and was appreciative for the call.

## 2020-06-03 NOTE — Patient Instructions (Signed)

## 2020-06-28 ENCOUNTER — Ambulatory Visit (INDEPENDENT_AMBULATORY_CARE_PROVIDER_SITE_OTHER): Payer: 59 | Admitting: Neurology

## 2020-06-28 ENCOUNTER — Other Ambulatory Visit: Payer: Self-pay

## 2020-06-28 DIAGNOSIS — R5383 Other fatigue: Secondary | ICD-10-CM

## 2020-06-28 DIAGNOSIS — G4733 Obstructive sleep apnea (adult) (pediatric): Secondary | ICD-10-CM | POA: Diagnosis not present

## 2020-06-28 DIAGNOSIS — M2619 Other specified anomalies of jaw-cranial base relationship: Secondary | ICD-10-CM

## 2020-06-28 DIAGNOSIS — G478 Other sleep disorders: Secondary | ICD-10-CM

## 2020-06-28 DIAGNOSIS — R0683 Snoring: Secondary | ICD-10-CM

## 2020-07-06 ENCOUNTER — Telehealth: Payer: Self-pay | Admitting: Neurology

## 2020-07-06 DIAGNOSIS — R0683 Snoring: Secondary | ICD-10-CM | POA: Insufficient documentation

## 2020-07-06 DIAGNOSIS — G478 Other sleep disorders: Secondary | ICD-10-CM | POA: Insufficient documentation

## 2020-07-06 DIAGNOSIS — M2619 Other specified anomalies of jaw-cranial base relationship: Secondary | ICD-10-CM | POA: Insufficient documentation

## 2020-07-06 DIAGNOSIS — R5383 Other fatigue: Secondary | ICD-10-CM | POA: Insufficient documentation

## 2020-07-06 DIAGNOSIS — G4733 Obstructive sleep apnea (adult) (pediatric): Secondary | ICD-10-CM | POA: Insufficient documentation

## 2020-07-06 NOTE — Addendum Note (Signed)
Addended by: Melvyn Novas on: 07/06/2020 04:46 PM   Modules accepted: Orders

## 2020-07-06 NOTE — Telephone Encounter (Signed)
-----   Message from Melvyn Novas, MD sent at 07/06/2020  4:46 PM EDT ----- Summary & Diagnosis:     There is moderate to severe OSA noted at an AHI of 26.6/h and strong  REM sleep accentuation of apnea. The REM AHI was 42./h .  There was no hypoxia associated. Moderately loud snoring is present.  Recommendations:     I recommend positive airway therapy for this patient, based on  the strong REM sleep exacerbation. There is little improvement in  REM sleep dependent apnea to be gained by a dental device and the  same is true for inspire technology.   I recommend a nasal CPAP interface that can be used with a  chinstrap rather than a full face interface= the patient should  be fitted in person at DME. The autotitration device will be set  for 6-17 cm water, 2 cm EOPR and uses heated humidification.  Interpreting Physician: Melvyn Novas, MD

## 2020-07-06 NOTE — Telephone Encounter (Signed)
Called patient to discuss sleep study results. No answer at this time. LVM for the patient to call back.   

## 2020-07-06 NOTE — Telephone Encounter (Signed)
Phone rep worked office voicemail's, Pt left message asking when will she have her f/u to her sleep study.  Please call

## 2020-07-06 NOTE — Procedures (Addendum)
Patient Information     First Name: Demiah Last Name: Kaniesha Barile: [id number]  Birth Date: 1956/02/20 Age:   64  Gender: Female  Referring Provider: Guerry Bruin, MD BMI: 20.3 (W=119 lb, H=5' 4'')  Neck Circ.:  13 '' Epworth:  7 Mobile Phone:  Sleep Study Information    Study Date: 06/29/20 S/H/A Version: [id number] / [id number] / 77    History:     Ashley Estes is a 64 y.o. year old Asian female patient and seen here upon referral on 05/27/2020 from PCP, Dr Wylene Simmer. Chief concern: Mrs. Ashley Estes reports that she knows that she is snoring for quite a while, her siblings and even children have mentioned it while she was on vacation with them.  She has become increasingly fatigued, perhaps over the last year and months and she has volunteered less than she used to before. She is retired, so she does not have the same time constraints, and this allows her to take a nap every day but she is worried because she never needed to take a daily nap before now.  She has tried all kinds of over-the-counter approaches to improve her snoring, trying to sleep reclined sitting up, taping her mouth so that she would not have oral airflow, the slide strips nasal prongs, and also a device that sucks the tongue into the front of the mouth preventing it from falling back.  All of these attempts have left her with horrible sleep quality. She noticed fatigue and sleepiness and has never had a previous sleep study.      Summary & Diagnosis:      There is moderate severe OSA noted at an AHI of 26.6/h and strong REM sleep accentuation of apnea. The REM AHI was 42./h . There was no hypoxia noted.  Moderately loud snoring is present.  Recommendations:      I recommend positive airway therapy for this patient, based on the strong REM sleep exacerbation. There is little improvement in REM sleep dependent apnea to be gained by a dental device and the same is true for inspire technology.   I recommend a nasal CPAP interface that  can be used with a chinstrap rather than a full face interface= the patient should be fitted in person at DME. The autotitration device will be set for 6-17 cm water, 2 cm EOPR and uses heated humidification.  Interpreting Physician: Melvyn Novas, MD      Sleep Summary  Oxygen Saturation Statistics   Start Study Time: End Study Time: Total Recording Time:  12:03:42AM 8:01:35 AM 7 hrs,  Total Sleep Time % REM of Sleep Time:  7 hrs, 25 min 28.1    Mean: 94 Minimum: 91 Maximum: 98  Mean of Desaturations Nadirs (%):   93  Oxygen Desatur. %: 4-9 10-20 >20 Total  Events Number Total  20 100.0  0 0.0  0 0.0  20 100.0  Oxygen Saturation: <90 <=88 <85 <80 <70  Duration (minutes): Sleep % 0.0 0.0 0.0 0.0 0.0 0.0 0.0 0.0 0.0 0.0     Respiratory Indices      Total Events REM NREM All Night  pRDI:  200  pAHI:  192 ODI:  20  pAHIc:  4  % CSR: 0.0 43.5 42.4 5.3 0.6 22.1 21.0 1.9 0.6 27.7 26.6 2.8 0.6       Pulse Rate Statistics during Sleep (BPM)      Mean: 61 Minimum: 51 Maximum: 86    Indices are calculated  using technically valid sleep time of  7 hrs, 12 min. Central-Indices are calculated using technically valid sleep time of  6  hrs, 56 min. pRDI/pAHI are calculated using oxi desaturations ? 3%  Body Position Statistics  Position Supine Prone Right Left Non-Supine  Sleep (min) 405.4 0.0 0.0 40.0 40.0  Sleep % 91.0 0.0 0.0 9.0 9.0  pRDI 28.9 N/A N/A 16.6 16.6  pAHI 27.6 N/A N/A 16.6 16.6  ODI 3.1 N/A N/A 0.0 0.0     Snoring Statistics Snoring Level (dB) >40 >50 >60 >70 >80 >Threshold (45)  Sleep (min) 62.0 2.8 1.2 0.0 0.0 6.3  Sleep % 13.9 0.6 0.3 0.0 0.0 1.4    Mean: 40 dB Sleep Stages Chart                                         pAHI=26.6                                                     Mild              Moderate                    Severe                                                 5              15                    30

## 2020-07-06 NOTE — Telephone Encounter (Signed)
Pt's home sleep test completed on 06/28/2020. It can take 10-14 business days for Dr Vickey Huger to review and result the sleep study. Will call the patient back once I have her result. I will inform Dr Vickey Huger that she is reaching out.

## 2020-07-07 ENCOUNTER — Encounter: Payer: Self-pay | Admitting: Neurology

## 2020-07-07 NOTE — Telephone Encounter (Signed)
Pt returned call. Please call back at [phone]

## 2020-07-07 NOTE — Telephone Encounter (Signed)
I called pt. I advised pt that Dr. Vickey Huger reviewed their sleep study results and found that pt moderate sleep apnea is present. Dr. Vickey Huger recommends that pt start auto CPAP. I reviewed PAP compliance expectations with the pt. Pt is agreeable to starting a CPAP. I advised pt that an order will be sent to a DME, [facility] and [facility] will call the pt within about one week after they file with the pt's insurance. [facility]) will show the pt how to use the machine, fit for masks, and troubleshoot the CPAP if needed. A follow up appt was made for insurance purposes with Butch Penny, NP on Nov 4,2021 at 11 am. Pt verbalized understanding to arrive 15 minutes early and bring their CPAP. A letter with all of this information in it will be mailed to the pt as a reminder. I verified with the pt that the address we have on file is correct. Pt verbalized understanding of results. Pt had no questions at this time but was encouraged to call back if questions arise. I have sent the order to [facility]) and have received confirmation that they have received the order.

## 2020-07-13 NOTE — Telephone Encounter (Signed)
I did send the order to [facility] on 07/07/2020. I have resent a request advising [facility] to address this with the patient.

## 2020-07-13 NOTE — Telephone Encounter (Signed)
Pt has called to report that [facility] has not received the order from [facility] please call

## 2020-07-14 NOTE — Telephone Encounter (Signed)
Dear Mrs. Michalski,  I understand your frustration, but I wish you would just give it another week to get set up. Your AHI is not mild and Apnea should be treated.   The home sleep study was ordered on 05-27-2020, performed on 06-28-2020- interpreted (AHI 26.6/h) on  07-05-2020,  RN had processed 07-06-2020, and , forwarded to DME on 07-07-2020.  Another manufacturer of CPAP, [employer] ,had a massive recall of all their CPAP/ BiPAP machines produced in the last 8 years-  this has causes strains on the remaining producers of devices and waiting list for new CPAPs have increased.    I hoped this would not affect as many of our patients, but it has let to wait times.   Sincerely ,  Melvyn Novas, MD

## 2020-07-14 NOTE — Telephone Encounter (Signed)
Called the patient back, apologized as I was on a call when she called previously. Advised the patient that I sent her order on 18th, in which I confirmed with [facility] her order did go through to the company just this morning, so I know they have her order. Patient states that she just hung up the phone 30 min ago in which she was told they still do not have her information in the system. She proceeded to complain stating that she does not want to deal with a company that after 8 days doesn't even have her in the system. She states that this whole process has been horrible. I advised the patient that there has been a back order on CPAP machines all together which in itself is causing problems. I asked the patient if she had another company that she would like to pursue. She states she is wanting to "cancel the set up all together." I said, "ok, so you dont want to move forward with treating your sleep apnea?" She said, "correct, I have my diagnosis, but this entire process has been difficult, even calling your office ([facility]) has been horrible. I have to wait forever and I just don't want to move forward." She advised me to contact the company and pull her order back and not process anything through her insurance because she does not want to move forward. She states I have lived this long with it and there is no sense of urgency to treat it. She asked me to cancel order and cancel her follow up. She states if she has a change of heart she will reach out.

## 2020-07-14 NOTE — Telephone Encounter (Signed)
Pt would like to relay a  message: ready to pull the plug on any CPAP machine activity and is not satisfied. Spoke with [facility] and they do not have any order with my name.  Would like a call back.

## 2020-09-23 ENCOUNTER — Ambulatory Visit: Payer: Self-pay | Admitting: Adult Health

## 2021-03-17 DIAGNOSIS — E7800 Pure hypercholesterolemia, unspecified: Secondary | ICD-10-CM | POA: Diagnosis not present

## 2021-03-17 DIAGNOSIS — Z0000 Encounter for general adult medical examination without abnormal findings: Secondary | ICD-10-CM | POA: Diagnosis not present

## 2021-03-17 DIAGNOSIS — M810 Age-related osteoporosis without current pathological fracture: Secondary | ICD-10-CM | POA: Diagnosis not present

## 2021-03-21 ENCOUNTER — Ambulatory Visit: Payer: 59 | Admitting: Family Medicine

## 2021-03-24 DIAGNOSIS — M7501 Adhesive capsulitis of right shoulder: Secondary | ICD-10-CM | POA: Diagnosis not present

## 2021-03-24 DIAGNOSIS — R82998 Other abnormal findings in urine: Secondary | ICD-10-CM | POA: Diagnosis not present

## 2021-03-24 DIAGNOSIS — Z1331 Encounter for screening for depression: Secondary | ICD-10-CM | POA: Diagnosis not present

## 2021-03-24 DIAGNOSIS — Z0000 Encounter for general adult medical examination without abnormal findings: Secondary | ICD-10-CM | POA: Diagnosis not present

## 2021-03-24 DIAGNOSIS — Z1339 Encounter for screening examination for other mental health and behavioral disorders: Secondary | ICD-10-CM | POA: Diagnosis not present

## 2021-03-25 DIAGNOSIS — Z1212 Encounter for screening for malignant neoplasm of rectum: Secondary | ICD-10-CM | POA: Diagnosis not present

## 2021-03-31 ENCOUNTER — Encounter (HOSPITAL_COMMUNITY): Payer: Self-pay

## 2021-03-31 ENCOUNTER — Emergency Department (HOSPITAL_COMMUNITY): Payer: BC Managed Care – PPO

## 2021-03-31 ENCOUNTER — Observation Stay (HOSPITAL_COMMUNITY)
Admission: EM | Admit: 2021-03-31 | Discharge: 2021-04-03 | Disposition: A | Discharge status: Home / Self Care | Payer: BC Managed Care – PPO | Attending: Student | Admitting: Student

## 2021-03-31 DIAGNOSIS — Z20822 Contact with and (suspected) exposure to covid-19: Secondary | ICD-10-CM | POA: Diagnosis not present

## 2021-03-31 DIAGNOSIS — Z7982 Long term (current) use of aspirin: Secondary | ICD-10-CM | POA: Diagnosis not present

## 2021-03-31 DIAGNOSIS — W19XXXA Unspecified fall, initial encounter: Secondary | ICD-10-CM | POA: Insufficient documentation

## 2021-03-31 DIAGNOSIS — H5500 Unspecified nystagmus: Secondary | ICD-10-CM

## 2021-03-31 DIAGNOSIS — E785 Hyperlipidemia, unspecified: Secondary | ICD-10-CM | POA: Diagnosis not present

## 2021-03-31 DIAGNOSIS — S7012XA Contusion of left thigh, initial encounter: Principal | ICD-10-CM | POA: Insufficient documentation

## 2021-03-31 DIAGNOSIS — G4733 Obstructive sleep apnea (adult) (pediatric): Secondary | ICD-10-CM

## 2021-03-31 DIAGNOSIS — R42 Dizziness and giddiness: Secondary | ICD-10-CM

## 2021-03-31 DIAGNOSIS — R112 Nausea with vomiting, unspecified: Secondary | ICD-10-CM | POA: Diagnosis not present

## 2021-03-31 DIAGNOSIS — E7800 Pure hypercholesterolemia, unspecified: Secondary | ICD-10-CM | POA: Diagnosis not present

## 2021-03-31 DIAGNOSIS — Y92239 Unspecified place in hospital as the place of occurrence of the external cause: Secondary | ICD-10-CM | POA: Diagnosis not present

## 2021-03-31 DIAGNOSIS — E876 Hypokalemia: Secondary | ICD-10-CM | POA: Diagnosis not present

## 2021-03-31 DIAGNOSIS — R110 Nausea: Secondary | ICD-10-CM

## 2021-03-31 DIAGNOSIS — Z0389 Encounter for observation for other suspected diseases and conditions ruled out: Secondary | ICD-10-CM | POA: Diagnosis not present

## 2021-03-31 DIAGNOSIS — H8120 Vestibular neuronitis, unspecified ear: Secondary | ICD-10-CM

## 2021-03-31 HISTORY — DX: Vestibular neuronitis, unspecified ear: H81.20

## 2021-03-31 LAB — CBC WITH DIFFERENTIAL/PLATELET
Abs Immature Granulocytes: 0.08 10*3/uL — ABNORMAL HIGH (ref 0.00–0.07)
Basophils Absolute: 0.1 10*3/uL (ref 0.0–0.1)
Basophils Relative: 1 %
Eosinophils Absolute: 0.1 10*3/uL (ref 0.0–0.5)
Eosinophils Relative: 1 %
HCT: 39.5 % (ref 36.0–46.0)
Hemoglobin: 13 g/dL (ref 12.0–15.0)
Immature Granulocytes: 1 %
Lymphocytes Relative: 11 %
Lymphs Abs: 1.3 10*3/uL (ref 0.7–4.0)
MCH: 29.2 pg (ref 26.0–34.0)
MCHC: 32.9 g/dL (ref 30.0–36.0)
MCV: 88.8 fL (ref 80.0–100.0)
Monocytes Absolute: 0.5 10*3/uL (ref 0.1–1.0)
Monocytes Relative: 4 %
Neutro Abs: 9.7 10*3/uL — ABNORMAL HIGH (ref 1.7–7.7)
Neutrophils Relative %: 82 %
Platelets: 240 10*3/uL (ref 150–400)
RBC: 4.45 MIL/uL (ref 3.87–5.11)
RDW: 11.9 % (ref 11.5–15.5)
WBC: 11.7 10*3/uL — ABNORMAL HIGH (ref 4.0–10.5)
nRBC: 0 % (ref 0.0–0.2)

## 2021-03-31 LAB — BASIC METABOLIC PANEL
Anion gap: 6 (ref 5–15)
BUN: 17 mg/dL (ref 8–23)
CO2: 22 mmol/L (ref 22–32)
Calcium: 7.9 mg/dL — ABNORMAL LOW (ref 8.9–10.3)
Chloride: 111 mmol/L (ref 98–111)
Creatinine, Ser: 0.76 mg/dL (ref 0.44–1.00)
GFR, Estimated: >60 mL/min (ref >60)
Glucose, Bld: 157 mg/dL — ABNORMAL HIGH (ref 70–99)
Potassium: 3.9 mmol/L (ref 3.5–5.1)
Sodium: 139 mmol/L (ref 135–145)

## 2021-03-31 IMAGING — MR MR MRA HEAD W/O CM
2 series · 18 of 48 positions shown · non-contrast
Comparison: No pertinent prior exam.
COMPARISON: None available.

CLINICAL DATA: Initial evaluation for acute dizziness.

EXAM:
MRI HEAD WITHOUT CONTRAST
MRA HEAD WITHOUT CONTRAST
TECHNIQUE: Multiplanar, multi-echo pulse sequences of the brain and surrounding
structures were acquired without intravenous contrast. Angiographic
images of the Circle of Willis were acquired using MRA technique
without intravenous contrast.

[Series 8: TOF · axial · 0.6mm · 0.35mm/px · z∈[-74,+23]mm · 17 of 172 slices shown]
[im 1/172]
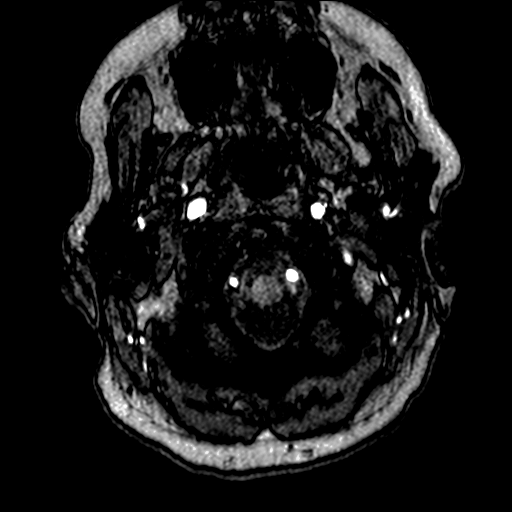
[im 4/172]
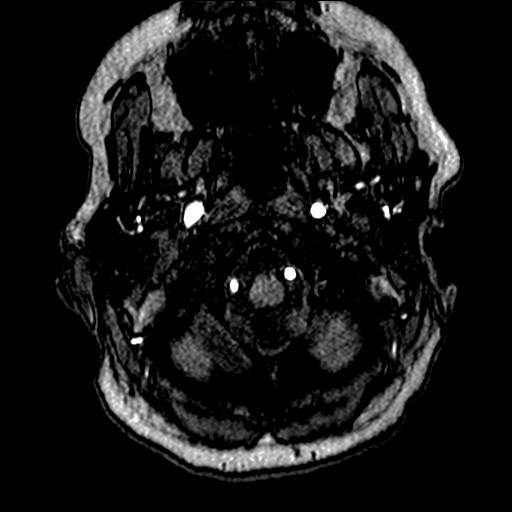
[im 8/172]
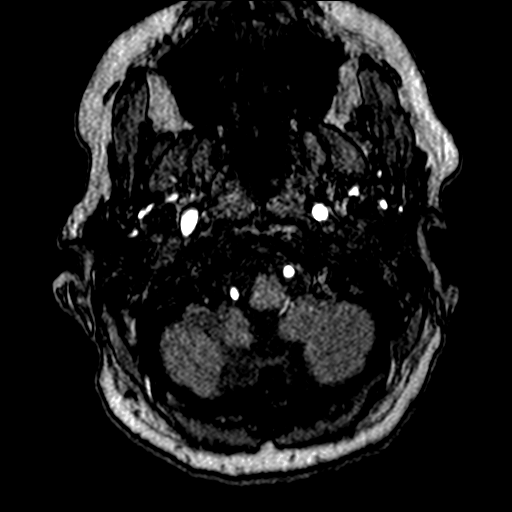
[im 12/172]
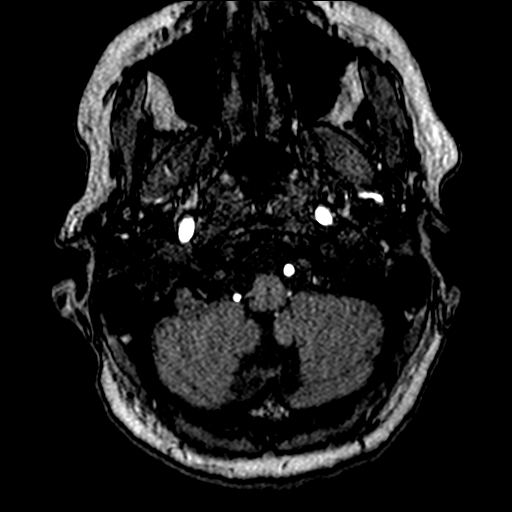
[im 15/172]
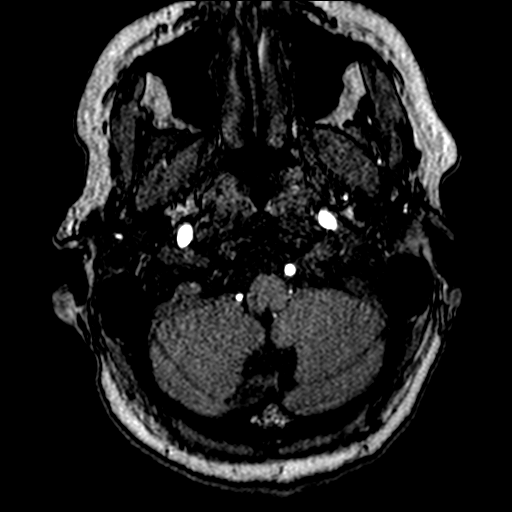
[im 19/172]
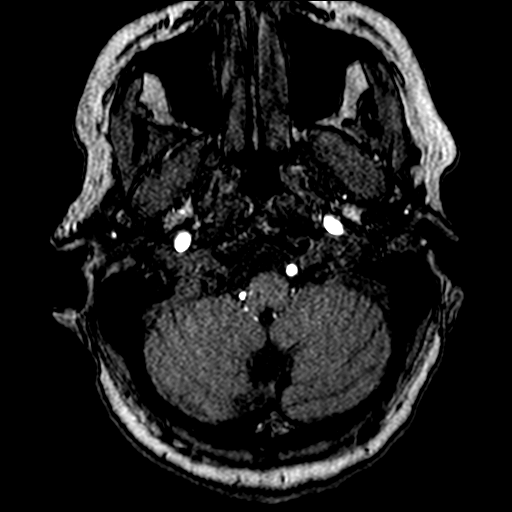
[im 23/172]
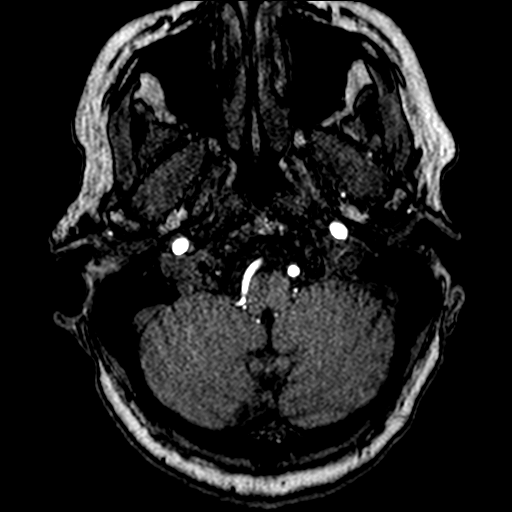
[im 27/172]
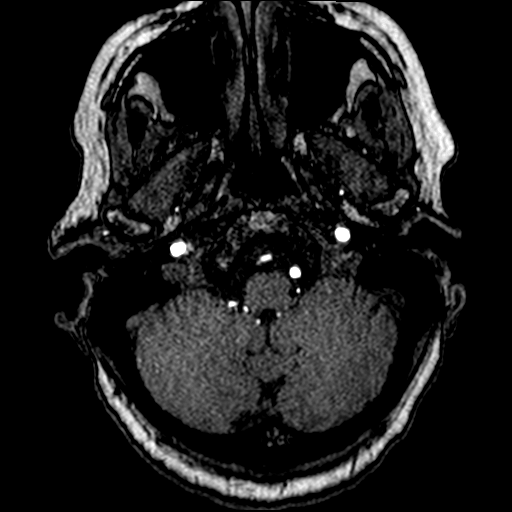
[im 30/172]
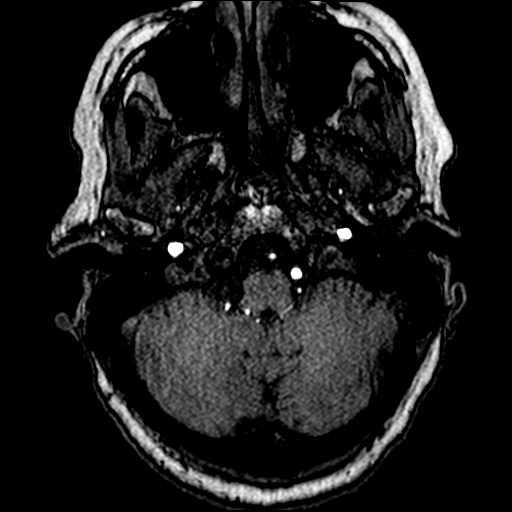
[im 53/172]
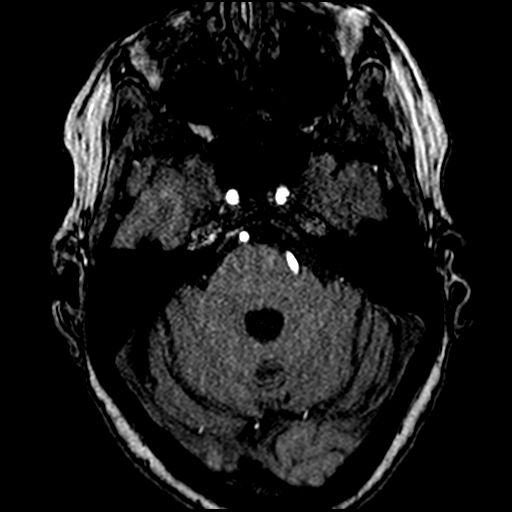
[im 75/172]
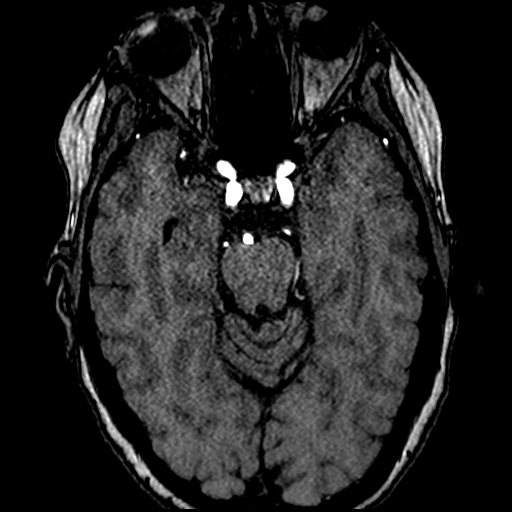
[im 86/172]
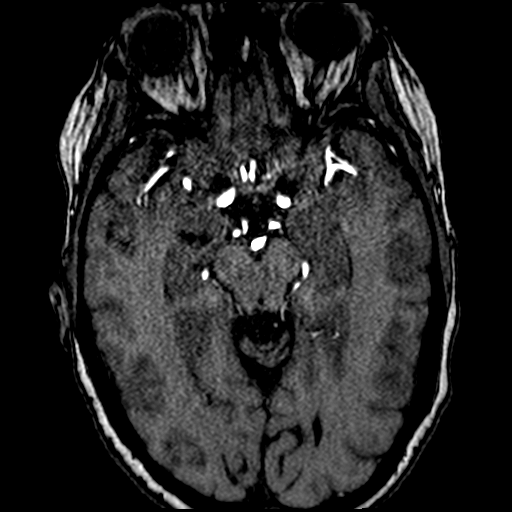
[im 97/172]
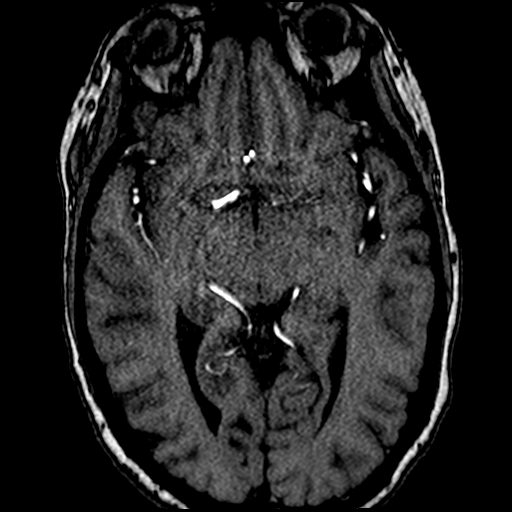
[im 119/172]
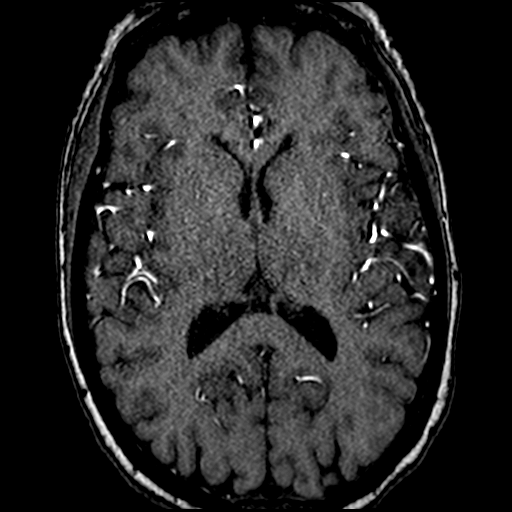
[im 142/172]
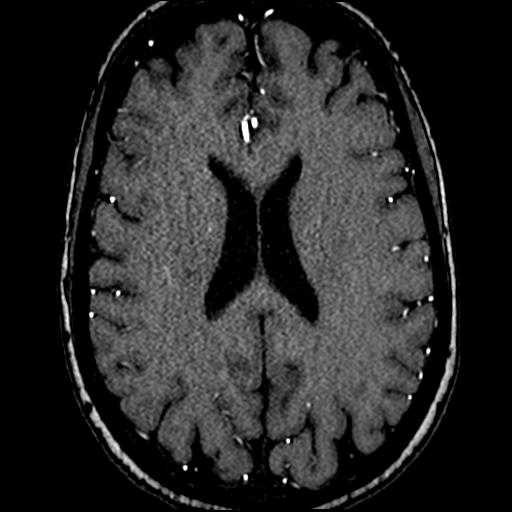
[im 145/172]
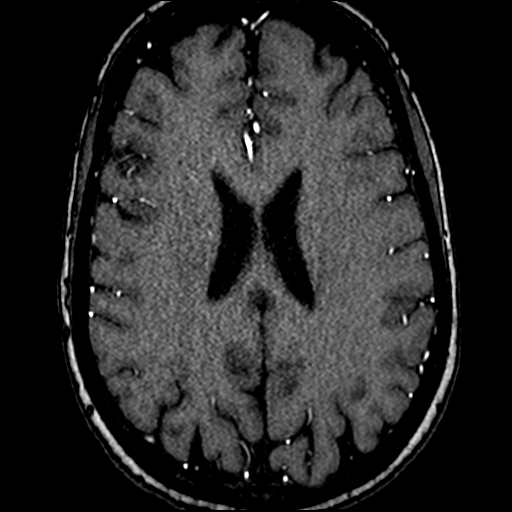
[im 164/172]
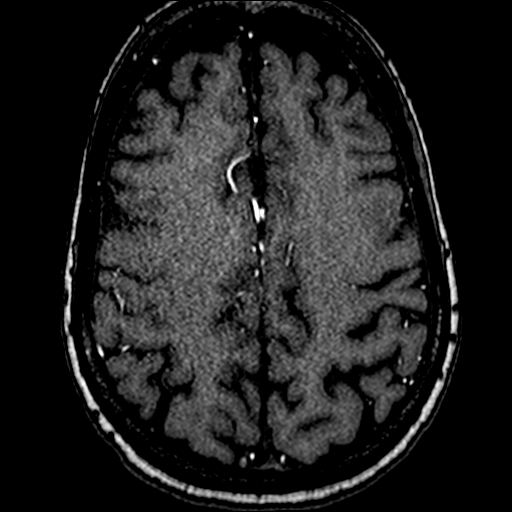

[Series 101: <mip range(1)> · axial · 0.6mm · 0.26mm/px · 1 of 1 slices shown]
[im 1/1]
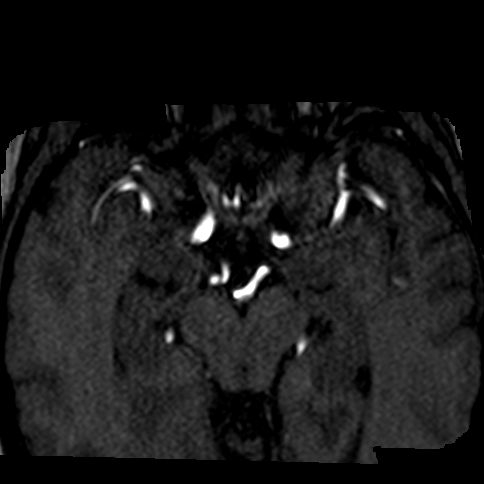

[18 of 48 positions shown; findings below may reference images not displayed]

FINDINGS: MRI HEAD FINDINGS

Brain: Cerebral volume within normal limits. Scattered patchy
subcentimeter foci of T2/FLAIR hyperintensity noted involving the
periventricular, deep, and subcortical white matter both cerebral
hemispheres, most pronounced at the frontal lobes bilaterally.
Findings are nonspecific, but overall mild in nature.

No abnormal foci of restricted diffusion to suggest acute or
subacute ischemia. Gray-white matter differentiation maintained. No
encephalomalacia to suggest chronic cortical infarction. No evidence
for acute or chronic intracranial hemorrhage.

No mass lesion, midline shift or mass effect. No hydrocephalus or
extra-axial fluid collection. Pituitary gland and suprasellar region
within normal limits. Midline structures intact.

Vascular: Major intracranial vascular flow voids are maintained.

Skull and upper cervical spine: Craniocervical junction within
normal limits. Bone marrow signal intensity normal. No scalp soft
tissue abnormality.

Sinuses/Orbits: Globes and orbital soft tissues demonstrate no acute
finding. Paranasal sinuses are clear. No mastoid effusion. Inner ear
structures grossly normal.

Other: None.

MRA HEAD FINDINGS

ANTERIOR CIRCULATION:

Visualized distal cervical segments of both internal carotid
arteries are widely patent with antegrade flow. Petrous, cavernous,
and supraclinoid segments patent without stenosis or other
abnormality. A1 segments patent bilaterally. Normal anterior
communicating artery complex. Anterior cerebral arteries patent to
their distal aspects without stenosis. No M1 stenosis or occlusion.
Normal MCA bifurcations. Distal MCA branches well perfused and
symmetric.

POSTERIOR CIRCULATION:

Both V4 segments widely patent to the vertebrobasilar junction. Left
vertebral artery dominant. Both PICA origins patent and normal.
Basilar patent to its distal aspect without stenosis. Superior
cerebellar arteries patent bilaterally. Both PCA supplied via the
basilar as well as prominent bilateral posterior communicating
arteries. Both PCAs well perfused to their distal aspects without
stenosis.

No intracranial aneurysm.
IMPRESSION: MRI HEAD IMPRESSION:

1. No acute intracranial abnormality.
2. Mild patchy subcentimeter foci of T2/FLAIR hyperintensity
involving the periventricular, deep, and subcortical white matter of
both cerebral hemispheres, overall mild in nature. Findings are
nonspecific, but favored to be related to underlying migrainous
disorder given patient history of migraines. Sequelae of chronic
microvascular ischemic disease would be the primary differential
consideration.

MRA HEAD IMPRESSION:

Normal intracranial MRA. No large vessel occlusion, hemodynamically
significant stenosis, or other acute vascular abnormality.

## 2021-03-31 IMAGING — MR MR HEAD W/O CM
12 series · 48 of 48 positions shown · non-contrast
Comparison: No pertinent prior exam.
COMPARISON: None available.

CLINICAL DATA: Initial evaluation for acute dizziness.

EXAM:
MRI HEAD WITHOUT CONTRAST
MRA HEAD WITHOUT CONTRAST
TECHNIQUE: Multiplanar, multi-echo pulse sequences of the brain and surrounding
structures were acquired without intravenous contrast. Angiographic
images of the Circle of Willis were acquired using MRA technique
without intravenous contrast.

[Series 5: DWI · axial · 3.0mm · 1.36mm/px · z∈[-41,+112]mm · 6 of 104 slices shown (1 of 2)]
[im 1/104]
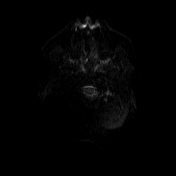
[im 21/104]
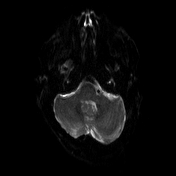
[im 42/104]
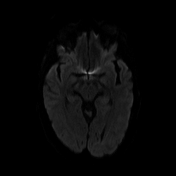
[im 62/104]
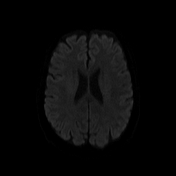
[im 83/104]
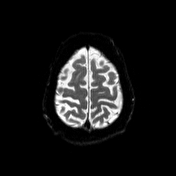
[im 104/104]
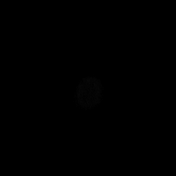

[Series 6: DWI · axial · 3.0mm · 1.36mm/px · z∈[-41,+112]mm · 3 of 50 slices shown (2 of 2)]
[im 1/50]
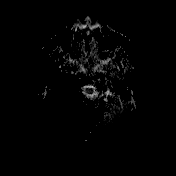
[im 25/50]
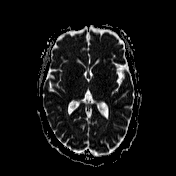
[im 50/50]
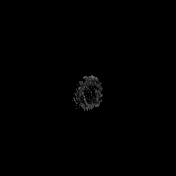

[Series 7: T1 · sagittal · 5.0mm · 0.75mm/px · 2 of 24 slices shown (1 of 2)]
[im 1/24]
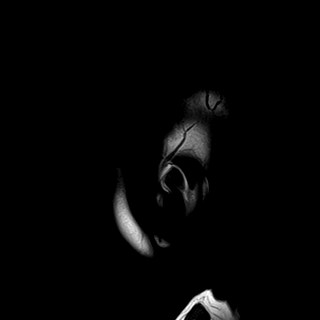
[im 24/24]
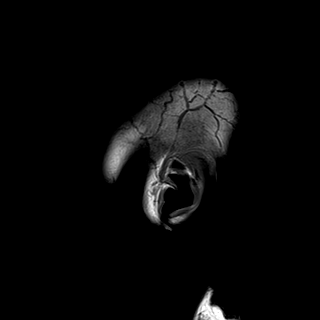

[Series 8: T2 · axial · 5.0mm · 0.62mm/px · z∈[-46,+116]mm · 2 of 26 slices shown (1 of 2)]
[im 1/26]
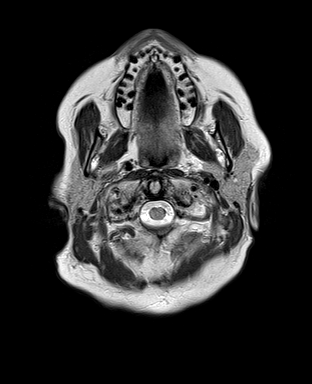
[im 26/26]
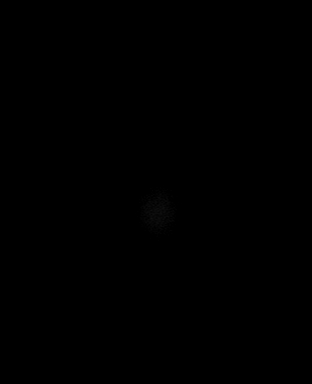

[Series 10: swi_images · axial · 3.0mm · 0.75mm/px · z∈[-53,+123]mm · 4 of 60 slices shown]
[im 1/60]
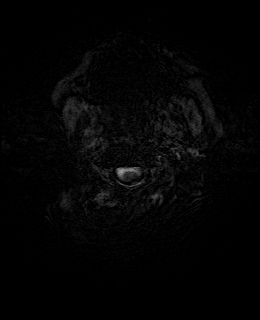
[im 20/60]
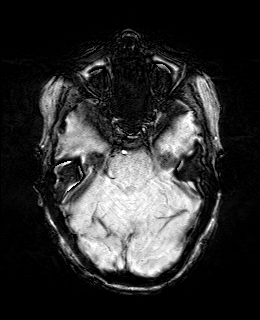
[im 40/60]
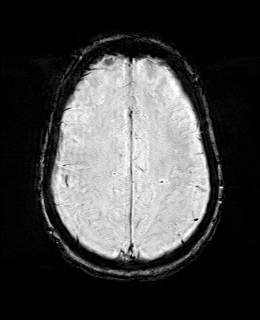
[im 60/60]
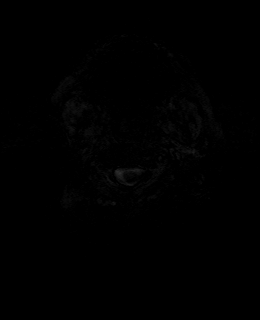

[Series 11: FLAIR · axial · 3.0mm · 0.75mm/px · z∈[-44,+114]mm · 4 of 54 slices shown]
[im 1/54]
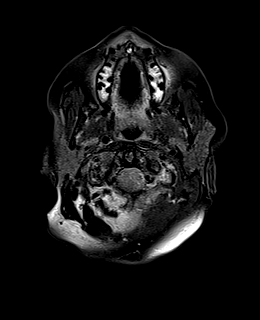
[im 18/54]
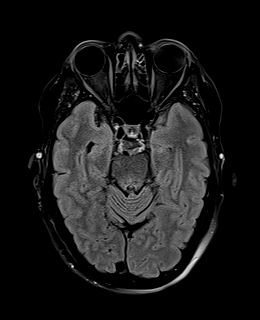
[im 36/54]
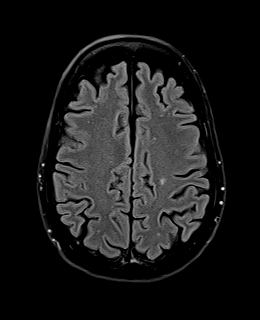
[im 54/54]
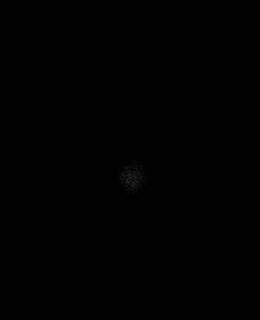

[Series 12: T1 · axial · 1.0mm · 0.94mm/px · z∈[-52,+122]mm · 12 of 176 slices shown (2 of 2)]
[im 1/176]
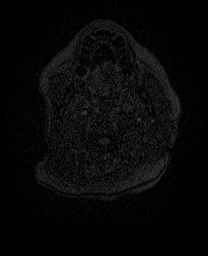
[im 16/176]
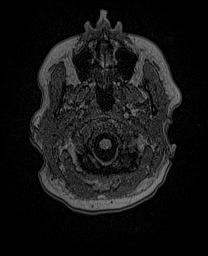
[im 32/176]
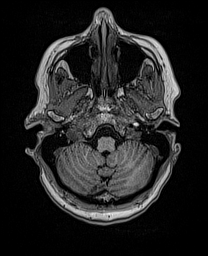
[im 48/176]
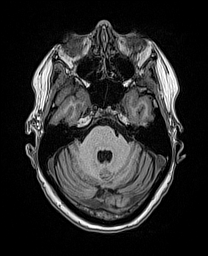
[im 64/176]
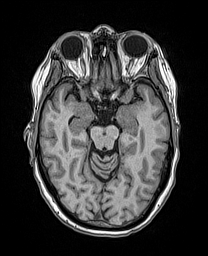
[im 80/176]
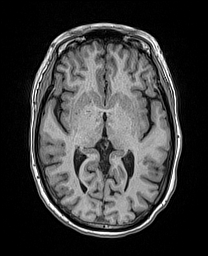
[im 96/176]
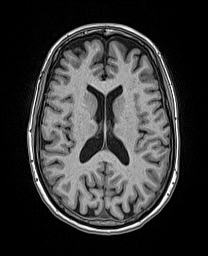
[im 112/176]
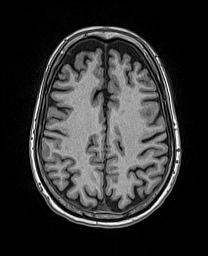
[im 128/176]
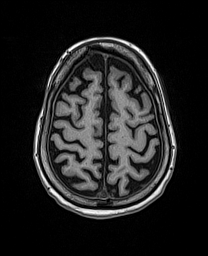
[im 144/176]
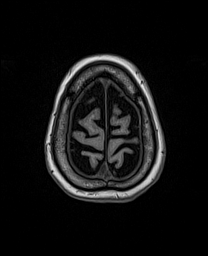
[im 160/176]
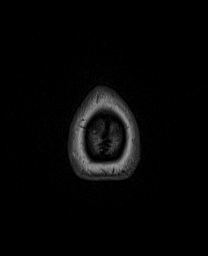
[im 176/176]
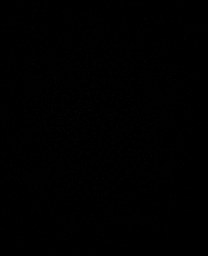

[Series 13: cor dwi_tracew · coronal · 5.0mm · 1.53mm/px · 4 of 64 slices shown]
[im 1/64]
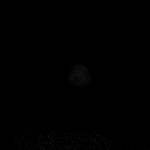
[im 22/64]
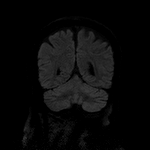
[im 43/64]
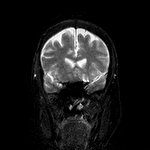
[im 64/64]
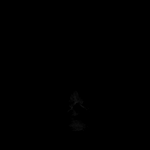

[Series 14: cor dwi_adc · coronal · 5.0mm · 1.53mm/px · 2 of 30 slices shown]
[im 1/30]
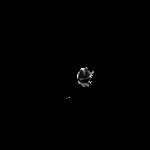
[im 30/30]
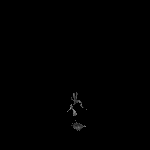

[Series 15: resolve dwi_tracew · axial · 5.0mm · 1.67mm/px · z∈[-49,+119]mm · 4 of 53 slices shown]
[im 1/53]
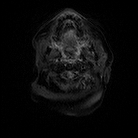
[im 18/53]
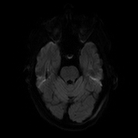
[im 35/53]
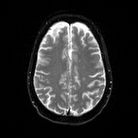
[im 53/53]
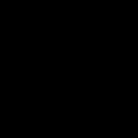

[Series 16: resolve dwi_adc · axial · 5.0mm · 1.67mm/px · z∈[-49,+106]mm · 2 of 24 slices shown]
[im 1/24]
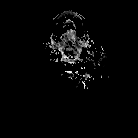
[im 24/24]
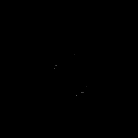

[Series 17: T2 · coronal · 5.0mm · 0.57mm/px · 3 of 40 slices shown (2 of 2)]
[im 1/40]
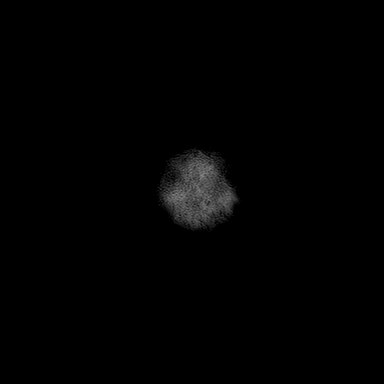
[im 20/40]
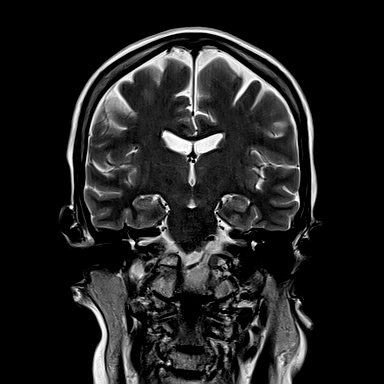
[im 40/40]
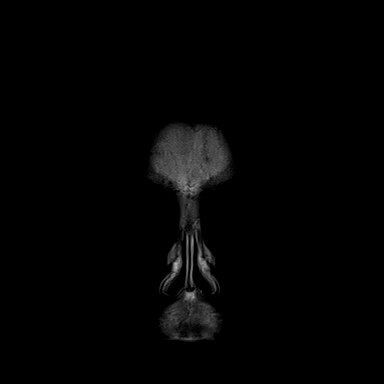

[48 of 48 positions shown; findings below may reference images not displayed]

FINDINGS: MRI HEAD FINDINGS

Brain: Cerebral volume within normal limits. Scattered patchy
subcentimeter foci of T2/FLAIR hyperintensity noted involving the
periventricular, deep, and subcortical white matter both cerebral
hemispheres, most pronounced at the frontal lobes bilaterally.
Findings are nonspecific, but overall mild in nature.

No abnormal foci of restricted diffusion to suggest acute or
subacute ischemia. Gray-white matter differentiation maintained. No
encephalomalacia to suggest chronic cortical infarction. No evidence
for acute or chronic intracranial hemorrhage.

No mass lesion, midline shift or mass effect. No hydrocephalus or
extra-axial fluid collection. Pituitary gland and suprasellar region
within normal limits. Midline structures intact.

Vascular: Major intracranial vascular flow voids are maintained.

Skull and upper cervical spine: Craniocervical junction within
normal limits. Bone marrow signal intensity normal. No scalp soft
tissue abnormality.

Sinuses/Orbits: Globes and orbital soft tissues demonstrate no acute
finding. Paranasal sinuses are clear. No mastoid effusion. Inner ear
structures grossly normal.

Other: None.

MRA HEAD FINDINGS

ANTERIOR CIRCULATION:

Visualized distal cervical segments of both internal carotid
arteries are widely patent with antegrade flow. Petrous, cavernous,
and supraclinoid segments patent without stenosis or other
abnormality. A1 segments patent bilaterally. Normal anterior
communicating artery complex. Anterior cerebral arteries patent to
their distal aspects without stenosis. No M1 stenosis or occlusion.
Normal MCA bifurcations. Distal MCA branches well perfused and
symmetric.

POSTERIOR CIRCULATION:

Both V4 segments widely patent to the vertebrobasilar junction. Left
vertebral artery dominant. Both PICA origins patent and normal.
Basilar patent to its distal aspect without stenosis. Superior
cerebellar arteries patent bilaterally. Both PCA supplied via the
basilar as well as prominent bilateral posterior communicating
arteries. Both PCAs well perfused to their distal aspects without
stenosis.

No intracranial aneurysm.
IMPRESSION: MRI HEAD IMPRESSION:

1. No acute intracranial abnormality.
2. Mild patchy subcentimeter foci of T2/FLAIR hyperintensity
involving the periventricular, deep, and subcortical white matter of
both cerebral hemispheres, overall mild in nature. Findings are
nonspecific, but favored to be related to underlying migrainous
disorder given patient history of migraines. Sequelae of chronic
microvascular ischemic disease would be the primary differential
consideration.

MRA HEAD IMPRESSION:

Normal intracranial MRA. No large vessel occlusion, hemodynamically
significant stenosis, or other acute vascular abnormality.

## 2021-03-31 MED ORDER — DIAZEPAM 5 MG PO TABS
5.0000 mg | ORAL_TABLET | Freq: Once | ORAL | Status: DC
  Filled 2021-03-31: qty 1

## 2021-03-31 MED ORDER — LORAZEPAM 2 MG/ML IJ SOLN
1.0000 mg | Freq: Once | INTRAMUSCULAR | Status: AC
  Administered 2021-03-31: 1 mg via INTRAVENOUS
  Filled 2021-03-31: qty 1

## 2021-03-31 MED ORDER — MECLIZINE HCL 25 MG PO TABS
25.0000 mg | ORAL_TABLET | Freq: Once | ORAL | Status: AC
  Administered 2021-03-31: 25 mg via ORAL
  Filled 2021-03-31: qty 1

## 2021-03-31 MED ORDER — SODIUM CHLORIDE 0.9 % IV BOLUS
1000.0000 mL | Freq: Once | INTRAVENOUS | Status: AC
  Administered 2021-03-31: 1000 mL via INTRAVENOUS

## 2021-03-31 MED ORDER — SCOPOLAMINE 1 MG/3DAYS TD PT72
1.0000 | MEDICATED_PATCH | TRANSDERMAL | Status: DC
  Administered 2021-04-01: 1.5 mg via TRANSDERMAL
  Filled 2021-03-31: qty 1

## 2021-03-31 MED ORDER — ONDANSETRON HCL 4 MG/2ML IJ SOLN
4.0000 mg | Freq: Once | INTRAMUSCULAR | Status: AC
  Administered 2021-03-31: 4 mg via INTRAVENOUS
  Filled 2021-03-31: qty 2

## 2021-03-31 NOTE — ED Triage Notes (Signed)
Pt presents via EMS from home with c/o dizziness and nausea since 10:30 this morning. Pt has an IV in place by EMS, 20 g left hand, 4 mg Zofran en route.

## 2021-03-31 NOTE — Consult Note (Signed)
[facility] Tele[facility]urology Consult Services  Stat Consult  Date of Service:   03/31/2021 21:50:39  Diagnosis:     .  R42 - Dizziness/ Vertigo/ Giddiness  Impression: History and exam suggestive of possible benign positional vertigo. I would suggest continuing her on meclizine. We will also start her on scopolamine patch. Will recommend vestibular rehab. She will need ENT consultation also if the symptoms persist.  CT HEAD: Showed No Acute Hemorrhage or Acute Core Infarct  Our recommendations are outlined below.  DVT Prophylaxis: Choice of Primary Team SCDs, Pneumatic Compression Lovenox or LMW Heparin  Disposition: Neurology will follow   Metrics: [facility] Notification Time: 03/31/2021 21:48:37 Stamp Time: 03/31/2021 21:50:39 Callback Response Time: 03/31/2021 22:01:20   ----------------------------------------------------------------------------------------------------  Chief Complaint: Dizziness  History of Present Illness: Patient is a 65 year old Female.  65 year old female with past medical history of hyperlipidemia and migraines came to the hospital because of dizziness. The dizziness is positional. Is associated with the room spinning sensation. She denies any focal body weakness. Denies any speech or swallowing problems. Denies any numbness or tingling. MRI of the head did not show any new stroke. She has received some Ativan and meclizine without any relief. She denies any focal weakness. Denies double vision. Denies any speech or swallowing problems. Denies any visual issues.    Past Medical History:     . Hyperlipidemia     . There is NO history of Hypertension     . There is NO history of Diabetes Mellitus     . There is NO history of Atrial Fibrillation     . There is NO history of Coronary Artery Disease     . There is NO history of Stroke     . There is NO history of Covid-19     . Migraine  Anticoagulant use:   No  Antiplatelet use: No    Examination: BP(96/62), Pulse(73), Blood Glucose(157) 1A: Level of Consciousness - Alert; keenly responsive + 0 1B: Ask Month and Age - Both Questions Right + 0 1C: Blink Eyes & Squeeze Hands - Performs Both Tasks + 0 2: Test Horizontal Extraocular Movements - Normal + 0 3: Test Visual Fields - No Visual Loss + 0 4: Test Facial Palsy (Use Grimace if Obtunded) - Normal symmetry + 0 5A: Test Left Arm Motor Drift - No Drift for 10 Seconds + 0 5B: Test Right Arm Motor Drift - No Drift for 10 Seconds + 0 6A: Test Left Leg Motor Drift - No Drift for 5 Seconds + 0 6B: Test Right Leg Motor Drift - No Drift for 5 Seconds + 0 7: Test Limb Ataxia (FNF/Heel-Shin) - No Ataxia + 0 8: Test Sensation - Normal; No sensory loss + 0 9: Test Language/Aphasia - Normal; No aphasia + 0 10: Test Dysarthria - Normal + 0 11: Test Extinction/Inattention - No abnormality + 0  NIHSS Score: 0     Patient / Family was informed the Neurology Consult would occur via TeleHealth consult by way of interactive audio and video telecommunications and consented to receiving care in this manner.  Patient is being evaluated for possible acute neurologic impairment and high probability of imminent or life - threatening deterioration.I spent total of 30 minutes providing care to this patient, including time for face to face visit via telemedicine, review of medical records, imaging studies and discussion of findings with providers, the patient and / or family.   Dr Hedy Camara   [facility] [phone]  Case [id number]

## 2021-03-31 NOTE — ED Notes (Signed)
Warm blanket provided.

## 2021-03-31 NOTE — ED Notes (Signed)
Pt returned from MRI. Pt resting at this time.

## 2021-03-31 NOTE — ED Provider Notes (Signed)
[facility]-EMERGENCY DEPT Provider Note   CSN: [id number] Arrival date & time: 03/31/21  1154     History Chief Complaint  Patient presents with  . Dizziness  . Nausea    Ashley HoraJennifer A Estes is a 65 y.o. female who presents with concern for sudden onset of dizziness with the room spinning that started around 1030 this morning.  Patient states she was eating dining room table and stood quickly and turned when she began to experience her symptoms.  She endorses multiple episodes of NBNB emesis since onset of her dizziness but denies any changes in her hearing sensation.  Denies any numbness, tingling, weakness in her upper or lower extremities but feels generally fatigued after multiple episodes of vomiting.  She was administered Zofran by EMS in route.  Denies any history of cardiac disease, she is not on anticoagulation.  She denies any blurry vision or double vision, however states that she feels that the room is spinning when she opens her eyes.  I personally reviewed this patient's medical records.  She does have history of migraines, however when questioned states that this feels nothing like her prior migraines that she has no headache and she has never had dizziness associated with her migraines in the past.  HPI     Past Medical History:  Diagnosis Date  . Bursitis of shoulder   . Family history of breast cancer   . Family history of cancer   . Hypercholesterolemia   . Migraines   . Osteoporosis   . Periodontal disease     Patient Active Problem List   Diagnosis Date Noted  . Loud snoring 07/06/2020  . 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 Other fatigue 07/06/2020  . OSA (obstructive sleep apnea) 07/06/2020  . Family history of breast cancer   . Family history of cancer     History reviewed. No pertinent surgical history.   OB History    Gravida  1   Para  1   Term  1   Preterm      AB       Living  1     SAB      IAB      Ectopic      Multiple      Live Births              Family History  Problem Relation Age of Onset  . Cancer Mother        Dec from lung Ca age 64  . Breast cancer Mother 6345  . Thyroid disease Mother   . Ovarian cancer Mother        carcinoma in situ found on TAH-BAO at 3744  . Diabetes Father        d. from complications of diabetes at 8383  . Cancer Father        lung cancer - metastatic  . Migraines Sister   . Cancer Sister        skin cancer around the anus  . Breast cancer Maternal Aunt        d. 7062  . Dementia Paternal Aunt   . Other Paternal Uncle        both uncles died in their 7420s  . Dementia Maternal Grandmother   . Other Paternal Grandmother        d. in childbirth  . Other Paternal Grandfather        d. of ear infection  .  Breast cancer Other        MGMs sister    Social History   Tobacco Use  . Smoking status: Never Smoker  . Smokeless tobacco: Never Used  Vaping Use  . Vaping Use: Never used  Substance Use Topics  . Alcohol use: No    Alcohol/week: 0.0 standard drinks  . Drug use: No    Home Medications Prior to Admission medications   Medication Sig Start Date End Date Taking? Authorizing Provider  aspirin-acetaminophen-caffeine (EXCEDRIN MIGRAINE) [phone] MG per tablet Take 2 tablets by mouth every 6 (six) hours as needed for headache.   Yes [provider]  calcium carbonate (OS-CAL) 600 MG TABS tablet Take 600 mg by mouth 2 (two) times daily with a meal.   Yes [provider]  Multiple Vitamins-Minerals (MULTIVITAMIN PO) Take 1 tablet by mouth daily.   Yes [provider]  OVER THE COUNTER MEDICATION Take 1 tablet by mouth daily as needed (constipation). [facility] laxative tablet - Patient don't know the name   Yes [provider]  pravastatin (PRAVACHOL) 20 MG tablet Take 20 mg by mouth daily.   Yes [provider]  rizatriptan (MAXALT) 10 MG tablet Take 10 mg  by mouth daily as needed for migraine. 03/02/15  Yes [provider]  Tetrahydroz-Dextran-PEG-Povid (VISINE ADVANCED RELIEF OP) Place 1 drop into both eyes daily as needed (redness).   Yes [provider]    Allergies    Patient has no known allergies.  Review of Systems   Review of Systems  Constitutional: Positive for fatigue. Negative for activity change, appetite change, chills, diaphoresis, fever and unexpected weight change.  HENT: Negative.   Eyes: Positive for visual disturbance. Negative for photophobia, pain, discharge, redness and itching.  Respiratory: Negative.   Cardiovascular: Negative.   Gastrointestinal: Negative.   Genitourinary: Negative.   Musculoskeletal: Negative.   Skin: Negative.   Neurological: Positive for dizziness. Negative for tremors, seizures, syncope, facial asymmetry, speech difficulty, weakness, light-headedness, numbness and headaches.  Hematological: Negative.     Physical Exam Updated Vital Signs BP 106/64   Pulse 72   Temp 97.6 F (36.4 C)   Resp 14   LMP 11/20/2006 (Approximate)   SpO2 99%   Physical Exam Vitals and nursing note reviewed.  Constitutional:      Appearance: She is normal weight. She is ill-appearing. She is not toxic-appearing.  HENT:     Head: Normocephalic and atraumatic.     Nose: Nose normal.     Mouth/Throat:     Mouth: Mucous membranes are moist.     Pharynx: Oropharynx is clear. Uvula midline. No oropharyngeal exudate, posterior oropharyngeal erythema or uvula swelling.  Eyes:     General: Lids are normal. Vision grossly intact.        Right eye: No discharge.        Left eye: No discharge.     Extraocular Movements:     Right eye: Nystagmus present.     Left eye: Nystagmus present.     Conjunctiva/sclera: Conjunctivae normal.     Pupils: Pupils are equal, round, and reactive to light.  Neck:     Trachea: Trachea and phonation normal.  Cardiovascular:     Rate and Rhythm: Normal rate  and regular rhythm.     Pulses: Normal pulses.     Heart sounds: Normal heart sounds. No murmur heard.   Pulmonary:     Effort: Pulmonary effort is normal. No tachypnea, bradypnea, accessory muscle  usage, prolonged expiration or respiratory distress.     Breath sounds: Normal breath sounds. No wheezing or rales.  Chest:     Chest wall: No mass, lacerations, deformity, swelling, tenderness, crepitus or edema.  Abdominal:     General: Bowel sounds are normal. There is no distension.     Palpations: Abdomen is soft.     Tenderness: There is no abdominal tenderness. There is no right CVA tenderness, left CVA tenderness, guarding or rebound.  Musculoskeletal:        General: No deformity.     Cervical back: Normal range of motion and neck supple. No crepitus. No pain with movement, spinous process tenderness or muscular tenderness.     Right lower leg: No edema.     Left lower leg: No edema.  Lymphadenopathy:     Cervical: No cervical adenopathy.  Skin:    General: Skin is warm and dry.  Neurological:     Mental Status: She is alert and oriented to person, place, and time. Mental status is at baseline.     Cranial Nerves: Cranial nerves are intact.     Sensory: Sensation is intact.     Motor: Motor function is intact.     Comments: Neurologic exam abbreviated due to patient's inability to participate secondary to her severe dizziness and its associated nausea.  Unable to assess EOMs or visual fields.  Patient has horizontal nystagmus beating to the right anytime she opens her eyes.  Unable to perform hints exam due to patient's nausea and dizziness. Romberg and pronator drift not assessed due to patient's symptoms, however fine motor coronation with the fingers and heel-to-shin test were normal.  Finger-to-nose test not assessed given patient's inability to open her eyes without vomiting.  Gait not assessed.  There is symmetric strength and normal sensation in all 5 extremities.  5/5 grip  strength bilaterally, 5/5 strength in elbow flexion extension bilaterally.  5/5 strength in plantar dorsiflexion bilaterally.  Full range of motion of the hips bilaterally.  Psychiatric:        Mood and Affect: Mood normal.     ED Results / Procedures / Treatments   Labs (all labs ordered are listed, but only abnormal results are displayed) Labs Reviewed  BASIC METABOLIC PANEL - Abnormal; Notable for the following components:      Result Value   Glucose, Bld 157 (*)    Calcium 7.9 (*)    All other components within normal limits  CBC WITH DIFFERENTIAL/PLATELET - Abnormal; Notable for the following components:   WBC 11.7 (*)    Neutro Abs 9.7 (*)    Abs Immature Granulocytes 0.08 (*)    All other components within normal limits    EKG EKG Interpretation  Date/Time:  Thursday Mar 31 2021 14:13:06 EDT Ventricular Rate:  78 PR Interval:  173 QRS Duration: 94 QT Interval:  404 QTC Calculation: 461 R Axis:   49 Text Interpretation: Sinus rhythm Normal ECG No old tracing to compare Confirmed by Susy Frizzle [phone]) on 03/31/2021 2:37:22 PM   Radiology No results found.  Procedures Procedures   Medications Ordered in ED Medications  LORazepam (ATIVAN) injection 1 mg (has no administration in time range)  meclizine (ANTIVERT) tablet 25 mg (25 mg Oral Given 03/31/21 1347)  ondansetron (ZOFRAN) injection 4 mg (4 mg Intravenous Given 03/31/21 1317)  sodium chloride 0.9 % bolus 1,000 mL (1,000 mLs Intravenous New Bag/Given 03/31/21 1317)  LORazepam (ATIVAN) injection 1 mg (1 mg Intravenous Given 03/31/21  1409)    ED Course  I have reviewed the triage vital signs and the nursing notes.  Pertinent labs & imaging results that were available during my care of the patient were reviewed by me and considered in my medical decision making (see chart for details).    MDM Rules/Calculators/A&P                         64 year old female presents with concern for sudden onset  dizziness and sensation of the room is spinning with associated nausea and vomiting this morning.   Differential diagnosis includes but is not limited to BPPV, orthostatic hypotension, intracranial mass or mass-effect, labyrinthitis, vestibular neuritis, Ramsay Hunt syndrome, Mnire's disease, otic foreign body or barotrauma, cerebellar stroke, vertebrobasilar insufficiency, vertebral artery dissection, MS, migraine.  Vital signs are normal on intake.  Cardiopulmonary exam is normal, abdominal exam is benign.  Abbreviated neurologic exam is without focal deficit, however evaluation is limited due to patient's inability participate secondary to her dizziness/nausea.  Patient vomiting in the room.  When I rest patient is well-appearing.  Initial exam and HPI most consistent with BPPV, will trial medications and reevaluate.  CBC with mild leukocytosis of 11.7, BMP unremarkable.   Patient was administered fluid bolus, Zofran, and Antivert with minimal improvement, administered Ativan with significant improvement, however upon repeat evaluation she continues to have significant horizontal nystagmus to the right and is unable to keep her eyes open for more than a few seconds at a time secondary to dizziness.  She immediately begins vomiting again after opening her eyes.  Case discussed attending physician.  Will proceed with MRA for evaluation for possible intracranial abnormality or vascular deficit, as patient did not respond as expected to medications for vertigo.  Care signed out to attending physician, Dr. Jeraldine Loots, at time of shift change.  All pertinent HPI, physical exam, laboratory findings were discussed with him prior to my departure.  I appreciate his collaboration in the care of this patient.  Phyllicia voiced understanding of her medical evaluation and treatment plan thus far.  Each of her questions answered to her expressed satisfaction.  She is amenable to plan for MRA at this time.  Final  Clinical Impression(s) / ED Diagnoses Final diagnoses:  None    Rx / DC Orders ED Discharge Orders    None       Sherrilee Gilles 03/31/21 1754    Gerhard Munch, MD 03/31/21 2303    Gerhard Munch, MD 03/31/21 2316

## 2021-03-31 NOTE — ED Notes (Signed)
Patient transported to MRI 

## 2021-03-31 NOTE — ED Notes (Signed)
Patient dry heaving at this time. Admin Zofran and will go back and admin antivert

## 2021-03-31 NOTE — H&P (Signed)
History and Physical    Ashley Estes NAT:[id number] DOB: [birth year] DOA: 03/31/2021  PCP: Gaspar Garbe, MD  Patient coming from: Home via EMS  I have personally briefly reviewed patient's old medical records in [facility]  Chief Complaint: Dizziness, nausea  HPI: Ashley Estes is a 65 y.o. female with medical history significant for hyperlipidemia, migraines, OSA who presents to the ED for evaluation of new onset persistent dizziness, nausea, and vomiting.  Patient states she was in her usual state of health morning of 03/31/2021.  She was sitting down eating breakfast when she had sudden onset of dizziness with a spinning sensation.  Symptoms were initially mild and she was walking through the house however symptoms worsened and she developed unsteady gait due to the severe spinning sensation with her vision.  She then developed nausea with intractable vomiting.  She said her symptoms worsened even with just turning her head and seem to improve somewhat when she closes her eyes.  Due to the persistent severity of her symptoms she called EMS and she was brought to the ED for further evaluation.  She does report a history of migraines but does not recall any headache during this time.  She has not had any recent changes in her medications.  She denies any chest pain, dyspnea, abdominal pain, focal weakness, or dysuria.  She does report some loose stool which has occurred during her vomiting episodes.  She denies any similar symptoms in the past.  While in the ED she has been easily nauseated when she opens her eyes with frequent emesis.  ED Course:  Initial vitals showed BP 125/75, pulse 81, RR 18, temp 97.6 F, SPO2 95% on room air.  Labs show sodium 139, potassium 3.9, bicarb 22, BUN 17, creatinine 0.76, serum glucose 157, WBC 11.7, hemoglobin 13.0, platelets 240,000.  SARS-CoV-2 PCR obtained and pending.  MRI brain was negative for acute intracranial abnormality.  Mild patchy  subcentimeter foci of T2/FLAIR hyperintensity involving the periventricular, deep, and subcortical white matter of both cerebral hemispheres were noted and felt overall mild in nature.  Findings were favored to be related to underlying migrainous disorder.  MRA head was a normal study without evidence of large vessel occlusion.  Neurology were consulted and felt that findings were suggestive of possible benign positional vertigo.  Recommendations were to continue meclizine, start scopolamine patch.  Due to persistent nausea, vomiting, dizziness with unsafe gait the hospitalist service was consulted to admit for further evaluation and management.  Review of Systems: All systems reviewed and are negative except as documented in history of present illness above.   Past Medical History:  Diagnosis Date  . Bursitis of shoulder   . Family history of breast cancer   . Family history of cancer   . Hypercholesterolemia   . Migraines   . Osteoporosis   . Periodontal disease     History reviewed. No pertinent surgical history.  Social History:  reports that she has never smoked. She has never used smokeless tobacco. She reports that she does not drink alcohol and does not use drugs.  No Known Allergies  Family History  Problem Relation Age of Onset  . Cancer Mother        Dec from lung Ca age 69  . Breast cancer Mother 25  . Thyroid disease Mother   . Ovarian cancer Mother        carcinoma in situ found on TAH-BAO at 33  . Diabetes Father  d. from complications of diabetes at 70  . Cancer Father        lung cancer - metastatic  . Migraines Sister   . Cancer Sister        skin cancer around the anus  . Breast cancer Maternal Aunt        d. 4  . Dementia Paternal Aunt   . Other Paternal Uncle        both uncles died in their 19s  . Dementia Maternal Grandmother   . Other Paternal Grandmother        d. in childbirth  . Other Paternal Grandfather        d. of ear infection   . Breast cancer Other        MGMs sister     Prior to Admission medications   Medication Sig Start Date End Date Taking? Authorizing Provider  aspirin-acetaminophen-caffeine (EXCEDRIN MIGRAINE) [phone] MG per tablet Take 2 tablets by mouth every 6 (six) hours as needed for headache.   Yes [provider]  calcium carbonate (OS-CAL) 600 MG TABS tablet Take 600 mg by mouth 2 (two) times daily with a meal.   Yes [provider]  Multiple Vitamins-Minerals (MULTIVITAMIN PO) Take 1 tablet by mouth daily.   Yes [provider]  OVER THE COUNTER MEDICATION Take 1 tablet by mouth daily as needed (constipation). [facility] laxative tablet - Patient don't know the name   Yes [provider]  pravastatin (PRAVACHOL) 20 MG tablet Take 20 mg by mouth daily.   Yes [provider]  rizatriptan (MAXALT) 10 MG tablet Take 10 mg by mouth daily as needed for migraine. 03/02/15  Yes [provider]  Tetrahydroz-Dextran-PEG-Povid (VISINE ADVANCED RELIEF OP) Place 1 drop into both eyes daily as needed (redness).   Yes [provider]    Physical Exam: Vitals:   03/31/21 2130 03/31/21 2200 03/31/21 2230 03/31/21 2300  BP: 102/61 92/61 96/62  114/72  Pulse: 78 74 73 85  Resp: Temp:      SpO2: 96% 91% 97% 99%   Constitutional: Resting in bed with head slightly elevated, appears tired but in NAD, calm, comfortable Eyes: Keeps eyes closed most of evaluation but does display unprovoked leftward beating horizontal nystagmus when eyes are open, pupils are slightly dilated and reactive to light bilaterally. ENMT: Mucous membranes are moist. Posterior pharynx clear of any exudate or lesions.Normal dentition.  Neck: normal, supple, no masses. Respiratory: clear to auscultation bilaterally, no wheezing, no crackles. Normal respiratory effort. No accessory muscle use.  Cardiovascular: Regular rate and rhythm, no murmurs / rubs / gallops. No extremity  edema. 2+ pedal pulses. Abdomen: no tenderness, no masses palpated.  Musculoskeletal: no clubbing / cyanosis. No joint deformity upper and lower extremities. Good ROM, no contractures. Normal muscle tone.  Skin: no rashes, lesions, ulcers. No induration Neurologic: Unprovoked leftward beating horizontal nystagmus otherwise CN 2-12 grossly intact. Sensation intact. Strength 5/5 in all 4.  Psychiatric: Normal judgment and insight. Alert and oriented x 3. Normal mood.   Labs on Admission: I have personally reviewed following labs and imaging studies  CBC: Recent Labs  Lab 03/31/21 1249  WBC 11.7*  NEUTROABS 9.7*  HGB 13.0  HCT 39.5  MCV 88.8  PLT 240   Basic Metabolic Panel: Recent Labs  Lab 03/31/21 1249  NA 139  K 3.9  CL 111  CO2 22  GLUCOSE 157*  BUN 17  CREATININE 0.76  CALCIUM 7.9*  GFR: CrCl cannot be calculated (Unknown ideal weight.). Liver Function Tests: No results for input(s): AST, ALT, ALKPHOS, BILITOT, PROT, ALBUMIN in the last 168 hours. No results for input(s): LIPASE, AMYLASE in the last 168 hours. No results for input(s): AMMONIA in the last 168 hours. Coagulation Profile: No results for input(s): INR, PROTIME in the last 168 hours. Cardiac Enzymes: No results for input(s): CKTOTAL, CKMB, CKMBINDEX, TROPONINI in the last 168 hours. BNP (last 3 results) No results for input(s): PROBNP in the last 8760 hours. HbA1C: No results for input(s): HGBA1C in the last 72 hours. CBG: No results for input(s): GLUCAP in the last 168 hours. Lipid Profile: No results for input(s): CHOL, HDL, LDLCALC, TRIG, CHOLHDL, LDLDIRECT in the last 72 hours. Thyroid Function Tests: No results for input(s): TSH, T4TOTAL, FREET4, T3FREE, THYROIDAB in the last 72 hours. Anemia Panel: No results for input(s): VITAMINB12, FOLATE, FERRITIN, TIBC, IRON, RETICCTPCT in the last 72 hours. Urine analysis:    Component Value Date/Time   BILIRUBINUR n 03/24/2015 0914   PROTEINUR n  03/24/2015 0914   UROBILINOGEN negative 03/24/2015 0914   NITRITE n 03/24/2015 0914   LEUKOCYTESUR Negative 03/24/2015 0914    Radiological Exams on Admission: MR ANGIO HEAD WO CONTRAST  Result Date: 03/31/2021 CLINICAL DATA:  Initial evaluation for acute dizziness. EXAM: MRI HEAD WITHOUT CONTRAST MRA HEAD WITHOUT CONTRAST TECHNIQUE: Multiplanar, multi-echo pulse sequences of the brain and surrounding structures were acquired without intravenous contrast. Angiographic images of the Circle of Willis were acquired using MRA technique without intravenous contrast. COMPARISON: No pertinent prior exam. COMPARISON:  None available. FINDINGS: MRI HEAD FINDINGS Brain: Cerebral volume within normal limits. Scattered patchy subcentimeter foci of T2/FLAIR hyperintensity noted involving the periventricular, deep, and subcortical white matter both cerebral hemispheres, most pronounced at the frontal lobes bilaterally. Findings are nonspecific, but overall mild in nature. No abnormal foci of restricted diffusion to suggest acute or subacute ischemia. Gray-white matter differentiation maintained. No encephalomalacia to suggest chronic cortical infarction. No evidence for acute or chronic intracranial hemorrhage. No mass lesion, midline shift or mass effect. No hydrocephalus or extra-axial fluid collection. Pituitary gland and suprasellar region within normal limits. Midline structures intact. Vascular: Major intracranial vascular flow voids are maintained. Skull and upper cervical spine: Craniocervical junction within normal limits. Bone marrow signal intensity normal. No scalp soft tissue abnormality. Sinuses/Orbits: Globes and orbital soft tissues demonstrate no acute finding. Paranasal sinuses are clear. No mastoid effusion. Inner ear structures grossly normal. Other: None. MRA HEAD FINDINGS ANTERIOR CIRCULATION: Visualized distal cervical segments of both internal carotid arteries are widely patent with antegrade  flow. Petrous, cavernous, and supraclinoid segments patent without stenosis or other abnormality. A1 segments patent bilaterally. Normal anterior communicating artery complex. Anterior cerebral arteries patent to their distal aspects without stenosis. No M1 stenosis or occlusion. Normal MCA bifurcations. Distal MCA branches well perfused and symmetric. POSTERIOR CIRCULATION: Both V4 segments widely patent to the vertebrobasilar junction. Left vertebral artery dominant. Both PICA origins patent and normal. Basilar patent to its distal aspect without stenosis. Superior cerebellar arteries patent bilaterally. Both PCA supplied via the basilar as well as prominent bilateral posterior communicating arteries. Both PCAs well perfused to their distal aspects without stenosis. No intracranial aneurysm. IMPRESSION: MRI HEAD IMPRESSION: 1. No acute intracranial abnormality. 2. Mild patchy subcentimeter foci of T2/FLAIR hyperintensity involving the periventricular, deep, and subcortical white matter of both cerebral hemispheres, overall mild in nature. Findings are nonspecific, but favored to be related to underlying migrainous disorder given patient  history of migraines. Sequelae of chronic microvascular ischemic disease would be the primary differential consideration. MRA HEAD IMPRESSION: Normal intracranial MRA. No large vessel occlusion, hemodynamically significant stenosis, or other acute vascular abnormality. Electronically Signed   By: Rise Mu M.D.   On: 03/31/2021 19:25   MR BRAIN WO CONTRAST  Result Date: 03/31/2021 CLINICAL DATA:  Initial evaluation for acute dizziness. EXAM: MRI HEAD WITHOUT CONTRAST MRA HEAD WITHOUT CONTRAST TECHNIQUE: Multiplanar, multi-echo pulse sequences of the brain and surrounding structures were acquired without intravenous contrast. Angiographic images of the Circle of Willis were acquired using MRA technique without intravenous contrast. COMPARISON: No pertinent prior exam.  COMPARISON:  None available. FINDINGS: MRI HEAD FINDINGS Brain: Cerebral volume within normal limits. Scattered patchy subcentimeter foci of T2/FLAIR hyperintensity noted involving the periventricular, deep, and subcortical white matter both cerebral hemispheres, most pronounced at the frontal lobes bilaterally. Findings are nonspecific, but overall mild in nature. No abnormal foci of restricted diffusion to suggest acute or subacute ischemia. Gray-white matter differentiation maintained. No encephalomalacia to suggest chronic cortical infarction. No evidence for acute or chronic intracranial hemorrhage. No mass lesion, midline shift or mass effect. No hydrocephalus or extra-axial fluid collection. Pituitary gland and suprasellar region within normal limits. Midline structures intact. Vascular: Major intracranial vascular flow voids are maintained. Skull and upper cervical spine: Craniocervical junction within normal limits. Bone marrow signal intensity normal. No scalp soft tissue abnormality. Sinuses/Orbits: Globes and orbital soft tissues demonstrate no acute finding. Paranasal sinuses are clear. No mastoid effusion. Inner ear structures grossly normal. Other: None. MRA HEAD FINDINGS ANTERIOR CIRCULATION: Visualized distal cervical segments of both internal carotid arteries are widely patent with antegrade flow. Petrous, cavernous, and supraclinoid segments patent without stenosis or other abnormality. A1 segments patent bilaterally. Normal anterior communicating artery complex. Anterior cerebral arteries patent to their distal aspects without stenosis. No M1 stenosis or occlusion. Normal MCA bifurcations. Distal MCA branches well perfused and symmetric. POSTERIOR CIRCULATION: Both V4 segments widely patent to the vertebrobasilar junction. Left vertebral artery dominant. Both PICA origins patent and normal. Basilar patent to its distal aspect without stenosis. Superior cerebellar arteries patent bilaterally. Both  PCA supplied via the basilar as well as prominent bilateral posterior communicating arteries. Both PCAs well perfused to their distal aspects without stenosis. No intracranial aneurysm. IMPRESSION: MRI HEAD IMPRESSION: 1. No acute intracranial abnormality. 2. Mild patchy subcentimeter foci of T2/FLAIR hyperintensity involving the periventricular, deep, and subcortical white matter of both cerebral hemispheres, overall mild in nature. Findings are nonspecific, but favored to be related to underlying migrainous disorder given patient history of migraines. Sequelae of chronic microvascular ischemic disease would be the primary differential consideration. MRA HEAD IMPRESSION: Normal intracranial MRA. No large vessel occlusion, hemodynamically significant stenosis, or other acute vascular abnormality. Electronically Signed   By: Rise Mu M.D.   On: 03/31/2021 19:25    EKG: Personally reviewed. Normal sinus rhythm without acute ischemic changes.  No prior for comparison.  Assessment/Plan Principal Problem:   Intractable nausea and vomiting Active Problems:   OSA (obstructive sleep apnea)   Hypercholesterolemia   Ashley Estes is a 65 y.o. female with medical history significant for hyperlipidemia, migraines, OSA who is admitted with intractable nausea, vomiting, dizziness.  Intractable nausea, vomiting, dizziness: MRI brain shows mild bilateral patchy hyperintensities which were overall felt to be related to underlying migraine disorder.  MRA head was a normal study.  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  She has been seen by teleneurology. -Trial of  diazepam -Continue meclizine as needed, use IV Benadryl if unable to take p.o. -Scopolamine patch ordered -IV Phenergan as needed -Start continuous IV fluid hydration overnight -Consult vestibular PT  Hyperlipidemia: Resume home pravastatin when able to tolerate p.o.  OSA: Not using CPAP.  DVT prophylaxis:  Lovenox Code Status: DNR, confirmed with patient on admission Family Communication: Discussed with patient, she has discussed with family Disposition Plan: From home and likely discharge to home pending symptomatic improvement Consults called: Teleneurology Level of care: Med-Surg Admission status:  Status is: Observation  The patient remains OBS appropriate and will d/c before 2 midnights.  Dispo: The patient is from: Home              Anticipated d/c is to: Home              Patient currently is not medically stable to d/c.   Difficult to place patient No  Darreld Mclean MD [facility]  If 7PM-7AM, please contact night-coverage [email or web address]  03/31/2021, 11:35 PM

## 2021-04-01 ENCOUNTER — Observation Stay (HOSPITAL_COMMUNITY): Payer: BC Managed Care – PPO

## 2021-04-01 ENCOUNTER — Other Ambulatory Visit: Payer: Self-pay

## 2021-04-01 DIAGNOSIS — E876 Hypokalemia: Secondary | ICD-10-CM | POA: Diagnosis not present

## 2021-04-01 DIAGNOSIS — H05222 Edema of left orbit: Secondary | ICD-10-CM | POA: Diagnosis not present

## 2021-04-01 DIAGNOSIS — S0990XA Unspecified injury of head, initial encounter: Secondary | ICD-10-CM | POA: Diagnosis not present

## 2021-04-01 LAB — BASIC METABOLIC PANEL
Anion gap: 5 (ref 5–15)
BUN: 15 mg/dL (ref 8–23)
CO2: 26 mmol/L (ref 22–32)
Calcium: 8.8 mg/dL — ABNORMAL LOW (ref 8.9–10.3)
Chloride: 108 mmol/L (ref 98–111)
Creatinine, Ser: 0.62 mg/dL (ref 0.44–1.00)
GFR, Estimated: >60 mL/min (ref >60)
Glucose, Bld: 106 mg/dL — ABNORMAL HIGH (ref 70–99)
Potassium: 4 mmol/L (ref 3.5–5.1)
Sodium: 139 mmol/L (ref 135–145)

## 2021-04-01 LAB — CBC
HCT: 35.6 % — ABNORMAL LOW (ref 36.0–46.0)
Hemoglobin: 11.9 g/dL — ABNORMAL LOW (ref 12.0–15.0)
MCH: 29.7 pg (ref 26.0–34.0)
MCHC: 33.4 g/dL (ref 30.0–36.0)
MCV: 88.8 fL (ref 80.0–100.0)
Platelets: 216 10*3/uL (ref 150–400)
RBC: 4.01 MIL/uL (ref 3.87–5.11)
RDW: 11.9 % (ref 11.5–15.5)
WBC: 9.5 10*3/uL (ref 4.0–10.5)
nRBC: 0 % (ref 0.0–0.2)

## 2021-04-01 LAB — HEMOGLOBIN A1C
Hgb A1c MFr Bld: 5.7 % — ABNORMAL HIGH (ref 4.8–5.6)
Mean Plasma Glucose: 116.89 mg/dL

## 2021-04-01 LAB — HIV ANTIBODY (ROUTINE TESTING W REFLEX): HIV Screen 4th Generation wRfx: NONREACTIVE

## 2021-04-01 LAB — SARS CORONAVIRUS 2 (TAT 6-24 HRS): SARS Coronavirus 2: NEGATIVE

## 2021-04-01 IMAGING — CT CT HEAD W/O CM
4 series · 16 of 47 positions shown, 18 images · non-contrast
Comparison: None.

CLINICAL DATA: Orbital trauma, LEFT orbital hematoma.

EXAM:
CT HEAD WITHOUT CONTRAST
TECHNIQUE: Contiguous axial images were obtained from the base of the skull
through the vertex without intravenous contrast.

[Series 2: head wo · axial · 0.48mm/px · z∈[-83,+37]mm · 7 of 33 slices shown, 9 images]
[im 5/33  brain]
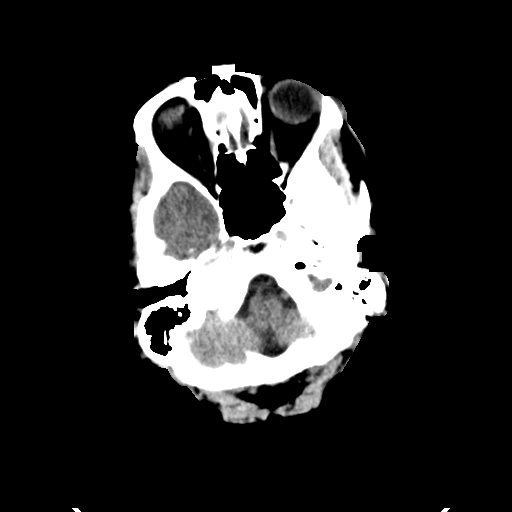
[im 5/33  bone]
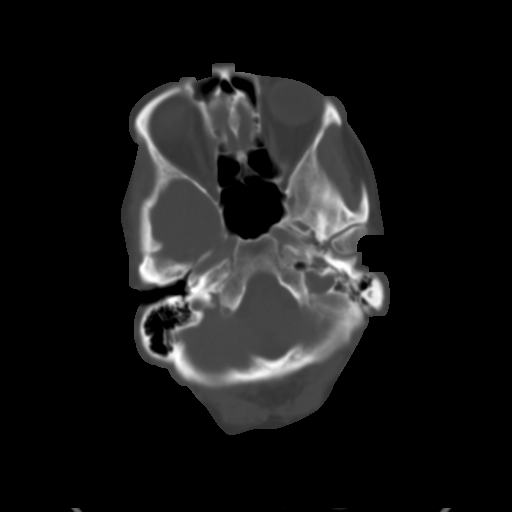
[im 9/33  brain]
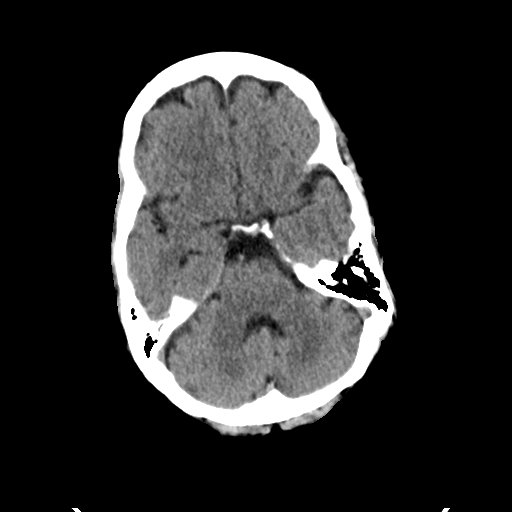
[im 13/33  brain]
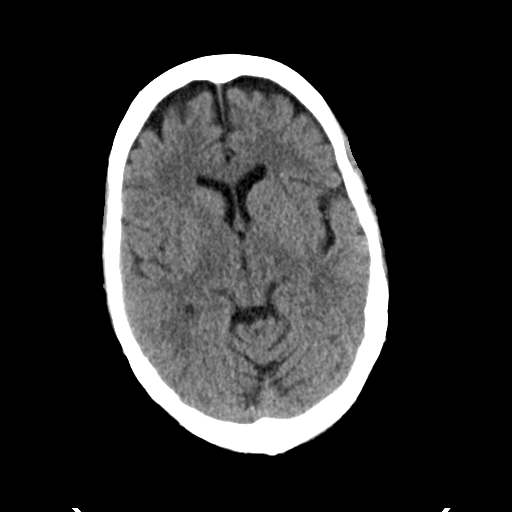
[im 17/33  brain]
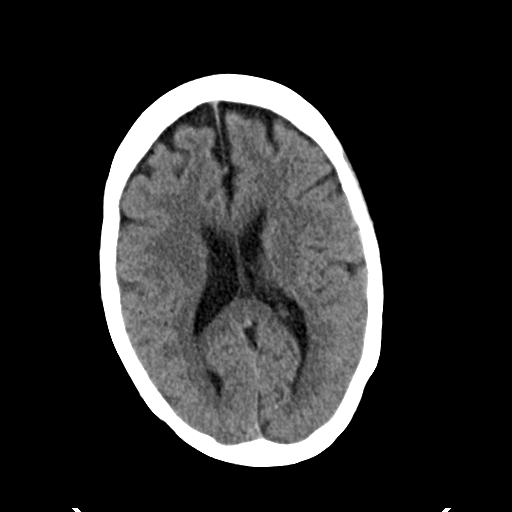
[im 21/33  brain]
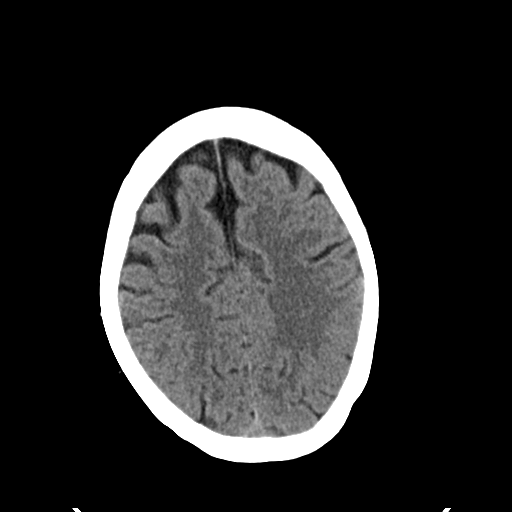
[im 21/33  bone]
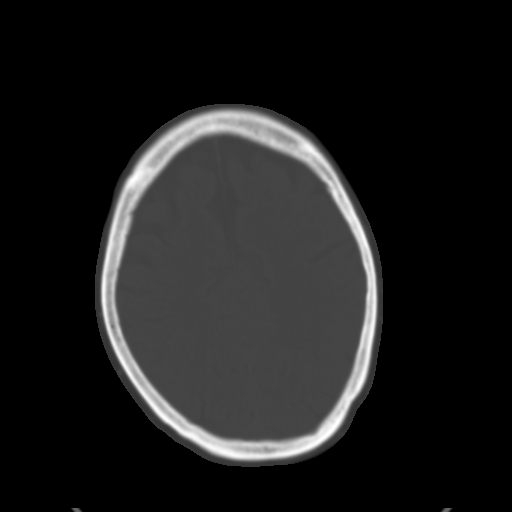
[im 25/33  brain]
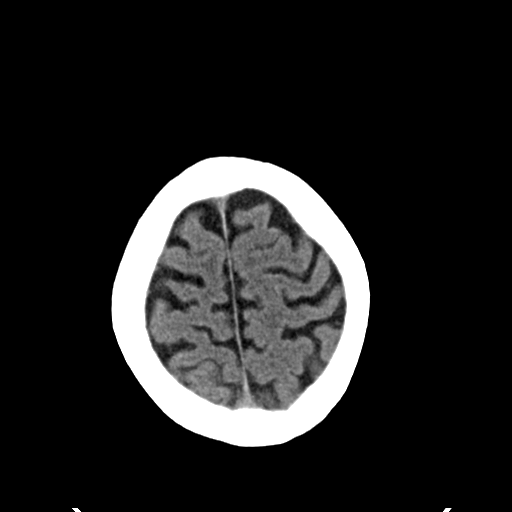
[im 29/33  brain]
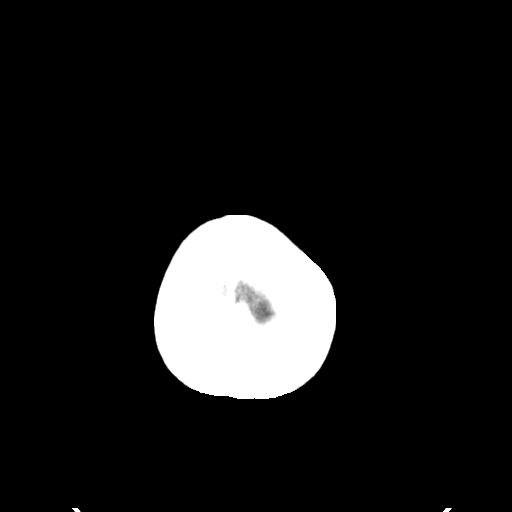

[Series 3: head bone · axial · 0.48mm/px · z∈[-87,-55]mm · 3 of 81 slices shown]
[im 9/81  bone]
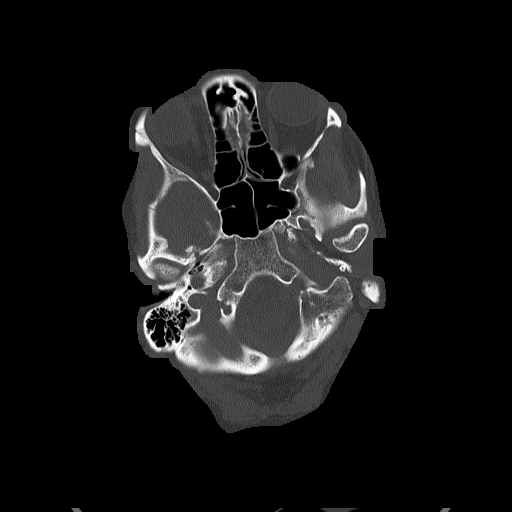
[im 17/81  bone]
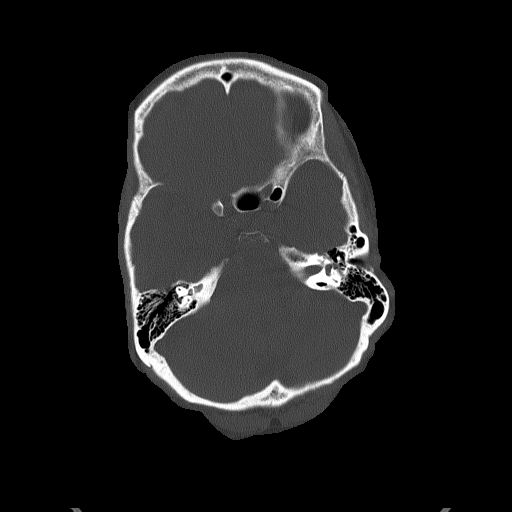
[im 25/81  bone]
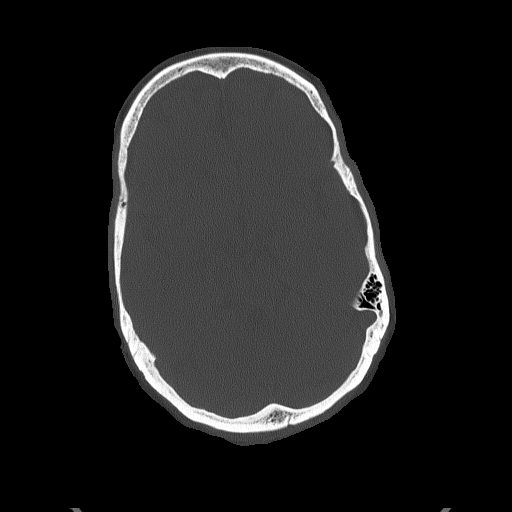

[Series 4: coronal soft tissue · coronal · 0.39mm/px · 3 of 71 slices shown]
[im 24/71  brain]
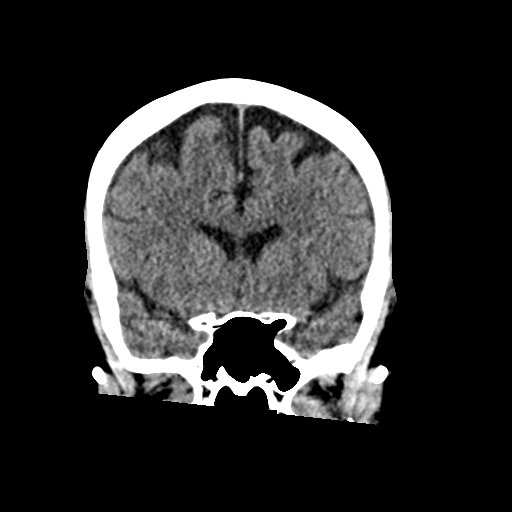
[im 32/71  brain]
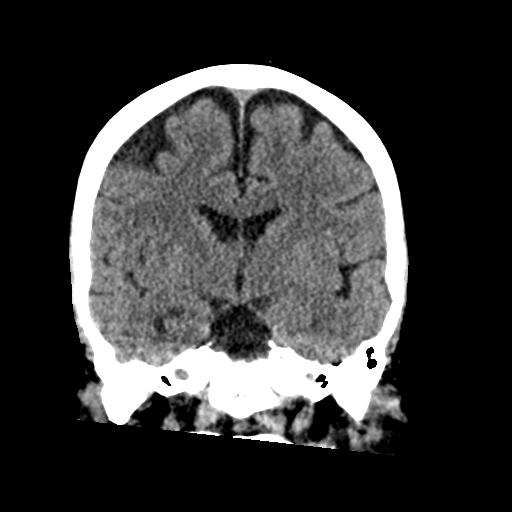
[im 39/71  brain]
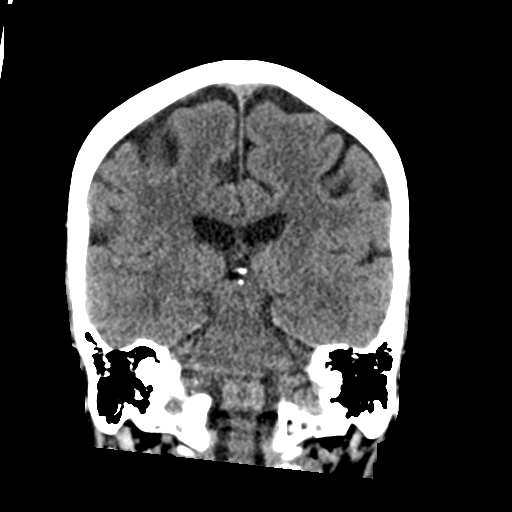

[Series 5: sagittal soft tissue · sagittal · 0.37mm/px · 3 of 56 slices shown]
[im 19/56  brain]
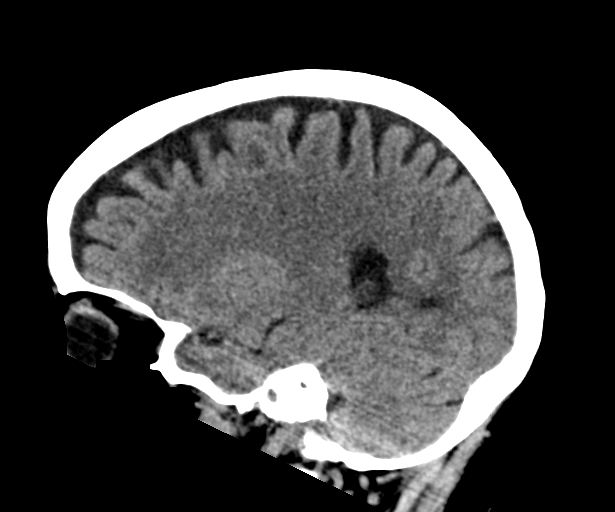
[im 28/56  brain]
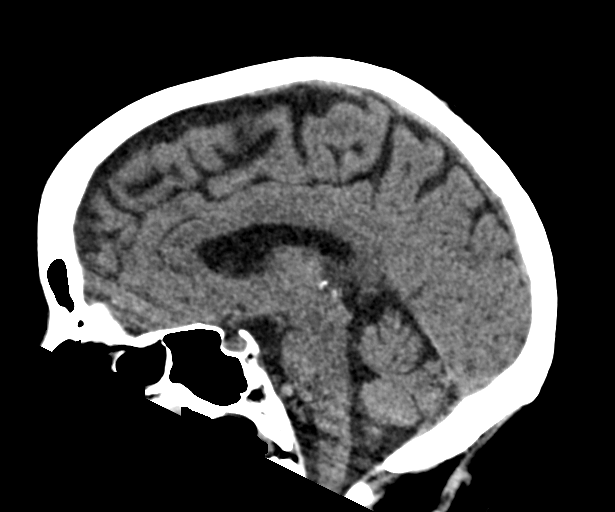
[im 37/56  brain]
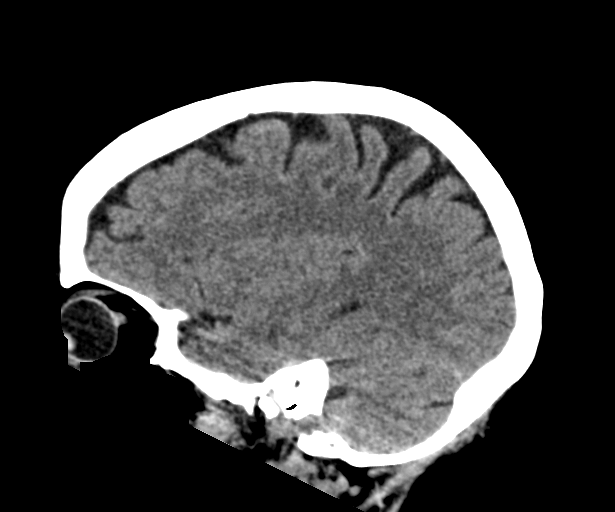

[16 of 47 positions shown; findings below may reference images not displayed]

FINDINGS: Brain: Ventricles are normal in size and configuration. No
extra-axial hemorrhage.

Linear hyperdense focus at the inferior RIGHT basal ganglia region
is most likely chronic calcifications, but cannot be completely
excluded as small acute parenchymal hemorrhage.

No other possible site of parenchymal hemorrhage. No evidence of
parenchymal mass or edema.

Vascular: No hyperdense vessel or unexpected calcification.

Skull: Normal. Negative for fracture or focal lesion.

Sinuses/Orbits: Visualized upper periorbital and retro-orbital soft
tissues are unremarkable. Visualized upper paranasal sinuses are
clear.

Other: Soft tissue edema overlying the lateral aspects of the LEFT
orbit. No underlying fracture is seen in this area.
IMPRESSION: 1. Linear hyperdense focus at the inferior aspect of the RIGHT basal
ganglia region is most likely chronic benign calcification, but
cannot be completely excluded as small acute parenchymal hemorrhage
given the lack of prior head CT for comparison. If patient is
clinically stable, recommend short-term follow-up head CT ([DATE]
hours) to ensure stability.
2. No evidence of intracranial hemorrhage elsewhere. No intracranial
mass, edema or mass effect.
3. Soft tissue edema overlying the lateral aspects of the LEFT
orbit. No underlying fracture.

These results will be called to the ordering clinician or
representative by the Radiologist Assistant, and communication
documented in the PACS or [REDACTED].

## 2021-04-01 MED ORDER — SODIUM CHLORIDE 0.9 % IV SOLN: INTRAVENOUS | Status: DC

## 2021-04-01 MED ORDER — ACETAMINOPHEN 325 MG PO TABS: 650.0000 mg | ORAL_TABLET | Freq: Four times a day (QID) | ORAL | Status: DC | PRN

## 2021-04-01 MED ORDER — ACETAMINOPHEN 650 MG RE SUPP: 650.0000 mg | Freq: Four times a day (QID) | RECTAL | Status: DC | PRN

## 2021-04-01 MED ORDER — SODIUM CHLORIDE 0.9 % IV SOLN
12.5000 mg | Freq: Four times a day (QID) | INTRAVENOUS | Status: DC | PRN
Start: 2021-04-01 — End: 2021-04-03
  Administered 2021-04-01: 12.5 mg via INTRAVENOUS
  Filled 2021-04-01: qty 12.5

## 2021-04-01 MED ORDER — DIPHENHYDRAMINE HCL 50 MG/ML IJ SOLN: 12.5000 mg | Freq: Four times a day (QID) | INTRAMUSCULAR | Status: DC | PRN

## 2021-04-01 MED ORDER — LACTATED RINGERS IV SOLN: INTRAVENOUS | Status: DC

## 2021-04-01 MED ORDER — ENOXAPARIN SODIUM 40 MG/0.4ML IJ SOSY
40.0000 mg | PREFILLED_SYRINGE | INTRAMUSCULAR | Status: DC
  Administered 2021-04-01: 40 mg via SUBCUTANEOUS
  Filled 2021-04-01: qty 0.4

## 2021-04-01 MED ORDER — MECLIZINE HCL 25 MG PO TABS
25.0000 mg | ORAL_TABLET | Freq: Three times a day (TID) | ORAL | Status: DC | PRN
  Administered 2021-04-01: 25 mg via ORAL
  Filled 2021-04-01: qty 1

## 2021-04-01 MED ORDER — ASPIRIN-ACETAMINOPHEN-CAFFEINE 250-250-65 MG PO TABS
2.0000 | ORAL_TABLET | Freq: Four times a day (QID) | ORAL | Status: DC | PRN
  Filled 2021-04-01: qty 2

## 2021-04-01 MED ORDER — SUMATRIPTAN SUCCINATE 50 MG PO TABS
100.0000 mg | ORAL_TABLET | ORAL | Status: DC | PRN
  Filled 2021-04-01: qty 2

## 2021-04-01 NOTE — Progress Notes (Signed)
PROGRESS NOTE  Ashley Estes  DOB: 12/16/1955  PCP: Gaspar Garbe, MD NOB:[id number]  DOA: 03/31/2021  LOS: 0 days  Hospital Day: 2   Chief Complaint  Patient presents with  . Dizziness  . Nausea   Brief narrative: Ashley Estes is a 64 y.o. female with PMH significant for hyperlipidemia, migraines, OSA Patient presented to the ED on 5/12 with complaint of new onset persistent dizziness, nausea, and vomiting for less than 24 hours.  She started having some dizziness while sitting down and taking her breakfast that morning.  Later in the morning, it progressively got worse and she also started having unsteady gait as well as nausea and vomiting. Due to the persistent severity of her symptoms she called EMS and she was brought to the ED for further evaluation.  In the ED, patient was afebrile, hemodynamically stable but having significant nausea and also few episodes of emesis. Blood work showed calcium level slightly low at 7.9, blood glucose level slightly elevated to 157 otherwise unremarkable.  MRI brain was negative for acute intracranial abnormality. There were mild findings of underlying migrainous disorder which include mild patchy subcentimeter foci of T2/FLAIR hyperintensity involving the periventricular, deep, and subcortical white matter of both cerebral hemispheres.   MRA head did not show any large vessel occlusion or significant stenosis.  EDP consulted with telemetry neurology, findings are suggestive of possible benign positional vertigo. Recommendations were to continue meclizine, start scopolamine patch.   Patient was admitted to hospital service for persistent symptoms. 5/13, this morning, patient got up from bed by herself against recommendation to go to the bathroom and fell on the floor face down.  She did not pass out, but has a left orbital hematoma.  Subjective: Patient was seen and examined this morning.  Pleasant middle-aged female.,  Lying on bed with  eyes closed to avoid vertigo.  Feels better now and was partially able to open eyes for me.  Has a left orbital hematoma that she got this morning after a fall.  Does not seem to have affected the vision. Chart reviewed Remains hemodynamically stable. Labs this morning with calcium level improved to 8.8, glucose down to 106  Assessment/Plan: Intractable nausea, vomiting, dizziness -MRI brain shows mild bilateral patchy hyperintensities which were overall felt to be related to underlying migraine disorder.   -Normal MRA head.   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   -Seen by teleneurology.  -Given a trial of diazepam -Also started on meclizine as needed, Benadryl as needed, Phenergan as needed and scopolamine patch.  -Given IV hydration overnight -Consult vestibular PT  Chronic migraine -Home meds include Excedrin and Maxalt.  Resume the same.  Fall in the hospital -5/13, this morning, patient got up from bed by herself against recommendation to go to the bathroom and fell on the floor face down.  She did not pass out, but has a left orbital hematoma. -We will obtain a noncontrast CT scan of head.  Hyperlipidemia Resume home pravastatin when able to tolerate p.o.  OSA Not using CPAP  Mobility: Encourage ambulation.  PT consulted Code Status:   Code Status: DNR for documentation Nutritional status: There is no height or weight on file to calculate BMI.     Diet Order            Diet NPO time specified Except for: Sips with Meds  Diet effective now  DVT prophylaxis: enoxaparin (LOVENOX) injection 40 mg Start: 04/01/21 1000   Antimicrobials:  None Fluid: Reduce IV fluid to 50 mill per hour Consultants: Telemetry neurology Family Communication:  None at bedside  Status is: Inpatient  Remains inpatient appropriate because: Continues to have severe vertigo  Dispo: The patient is from: Home              Anticipated d/c is to: Home in  1 to 2 days              Patient currently is not medically stable to d/c.   Difficult to place patient No     Infusions:  . sodium chloride    . promethazine (PHENERGAN) injection (IM or IVPB) Stopped (04/01/21 0035)    Scheduled Meds: . diazepam  5 mg Oral Once  . enoxaparin (LOVENOX) injection  40 mg Subcutaneous Q24H  . scopolamine  1 patch Transdermal Q72H    Antimicrobials: Anti-infectives (From admission, onward)   None      PRN meds: acetaminophen **OR** acetaminophen, aspirin-acetaminophen-caffeine, diphenhydrAMINE, meclizine, promethazine (PHENERGAN) injection (IM or IVPB), SUMAtriptan   Objective: Vitals:   04/01/21 0519 04/01/21 0947  BP: 116/60 110/64  Pulse: 66 60  Resp: 18 16  Temp: 98.5 F (36.9 C) 98.5 F (36.9 C)  SpO2:  100%    Intake/Output Summary (Last 24 hours) at 04/01/2021 1038 Last data filed at 04/01/2021 8182 Gross per 24 hour  Intake 768.71 ml  Output 800 ml  Net -31.29 ml   There were no vitals filed for this visit. Weight change:  There is no height or weight on file to calculate BMI.   Physical Exam: General exam: Pleasant, middle-aged female.  Not in physical distress Skin: No rashes, lesions or ulcers. HEENT: Has left orbital hematoma.  Vision not affected Lungs: Clear to auscultation bilaterally CVS: Regular rate and rhythm, no murmur GI/Abd soft, nontender, nondistended, bowel sound present CNS: Alert, oriented x3 Psychiatry: Mood appropriate Extremities: No pedal edema, no calf tenderness  Data Review: I have personally reviewed the laboratory data and studies available.  Recent Labs  Lab 03/31/21 1249 04/01/21 1030  WBC 11.7* 9.5  NEUTROABS 9.7*  --   HGB 13.0 11.9*  HCT 39.5 35.6*  MCV 88.8 88.8  PLT 240 216   Recent Labs  Lab 03/31/21 1249 04/01/21 0327  NA 139 139  K 3.9 4.0  CL 111 108  CO2 22 26  GLUCOSE 157* 106*  BUN 17 15  CREATININE 0.76 0.62  CALCIUM 7.9* 8.8*    F/u labs  ordered Unresulted Labs (From admission, onward)          Start     Ordered   04/02/21 0500  CBC with Differential/Platelet  Daily,   R      04/01/21 1037   04/02/21 0500  Basic metabolic panel  Daily,   R      04/01/21 1037   04/01/21 0836  Hemoglobin A1c  Add-on,   AD        04/01/21 0835   04/01/21 0500  HIV Antibody (routine testing w rflx)  (HIV Antibody (Routine testing w reflex) panel)  Tomorrow morning,   R        04/01/21 0010          Signed, Lorin Glass, MD [facility] 04/01/2021

## 2021-04-01 NOTE — Progress Notes (Signed)
CT head report reviewed.   There is a possibility of a small acute parenchymal hemorrhage in the infra aspect of the right basal ganglia.   Continue to clinically monitor for now.   Repeat CT head ordered for tomorrow am.

## 2021-04-01 NOTE — Evaluation (Signed)
Physical Therapy Evaluation Patient Details Name: Ashley Estes MRN: [id number] DOB: 1956/08/07 Today's Date: 04/01/2021   History of Present Illness  65 y.o. female presented to the ED on 5/12 with complaint of new onset persistent dizziness, nausea, and vomiting. MRI brain shows mild bilateral patchy hyperintensities which were overall felt to be related to underlying migraine disorder.   Pt with fall in hospital and sustained left orbital hematoma also with CT findings of possibility of a small acute parenchymal hemorrhage in the infra aspect of the right basal ganglia.  PMH significant for hyperlipidemia, migraines, OSA  Clinical Impression  Pt admitted with above diagnosis.  Pt currently with functional limitations due to the deficits listed below (see PT Problem List). Pt will benefit from skilled PT to increase their independence and safety with mobility to allow discharge to the venue listed below.  Performed vestibular testing on pt (see below).  Pt positive for downbeating rotary nystagmus during left Dix Hallpike lasting >60 seconds indicating anterior canal cupulolithiasis so performed Semont/Liberatory maneuver.  Pt reported spinning sensation improved with ambulation after treatment however continues to present with significant right lateral lean, feeling off balance, states her head feels like it's on a rubber band.  Pt lives alone and previously independent.  Pt would benefit from f/u PT as pt is significant unsteady and high fall risk.   04/01/21 1527  Vestibular Assessment  General Observation Pt with uneven eye horizon and leaning to right upon sitting.  Pt also with left orbital edema.  Symptom Behavior  Subjective history of current problem Started yesterday with sudden onset  Type of Dizziness  Spinning  Frequency of Dizziness with any movement  Duration of Dizziness "5 seconds"  Symptom Nature Motion provoked;Positional  Aggravating Factors Supine to sit;Sit to  stand;Turning head sideways;Turning head quickly  Relieving Factors Lying supine;Closing eyes  Progression of Symptoms Better  History of similar episodes none  Oculomotor Exam  Oculomotor Alignment Abnormal  Spontaneous Absent  Gaze-induced  Absent  Smooth Pursuits Intact  Saccades Overshoots  Oculomotor Exam-Fixation Suppressed   Head Shaking Nystagmus-Horizontal difficult for pt, not able to focus  Vestibulo-Ocular Reflex  VOR Cancellation Normal  Positional Testing  Dix-Hallpike Dix-Hallpike Right;Dix-Hallpike Left  Horizontal Canal Testing Horizontal Canal Right;Horizontal Canal Left  Dix-Hallpike Right  Dix-Hallpike Right Symptoms No nystagmus  Dix-Hallpike Left  Dix-Hallpike Left Symptoms Downbeat, left rotatory nystagmus  Horizontal Canal Right  Horizontal Canal Right Symptoms Normal  Horizontal Canal Left  Horizontal Canal Left Symptoms Normal      Follow Up Recommendations CIR    Equipment Recommendations  Rolling walker with 5" wheels    Recommendations for Other Services       Precautions / Restrictions Precautions Precautions: Fall      Mobility  Bed Mobility Overal bed mobility: Needs Assistance Bed Mobility: Supine to Sit;Sit to Supine     Supine to sit: Min assist Sit to supine: Min assist   General bed mobility comments: assist for trunk control    Transfers Overall transfer level: Needs assistance Equipment used: Rolling walker (2 wheeled) Transfers: Sit to/from Stand Sit to Stand: Min guard         General transfer comment: min/guard for safety  Ambulation/Gait Ambulation/Gait assistance: Min assist Gait Distance (Feet): 50 Feet Assistive device: Rolling walker (2 wheeled) Gait Pattern/deviations: Step-through pattern;Decreased stride length;Narrow base of support;Scissoring;Decreased weight shift to left     General Gait Details: significant leaning to right, cues for correcting COG and widening BOS, distance  to tolerance, pt  reports "spinning" improved but felling off balance  Stairs            Wheelchair Mobility    Modified Rankin (Stroke Patients Only)       Balance Overall balance assessment: Needs assistance Sitting-balance support: Feet supported;Bilateral upper extremity supported Sitting balance-Leahy Scale: Poor Sitting balance - Comments: requires UE support to correct leaning Postural control: Right lateral lean Standing balance support: Bilateral upper extremity supported Standing balance-Leahy Scale: Poor                               Pertinent Vitals/Pain Pain Assessment: Faces Faces Pain Scale: Hurts little more Pain Location: head Pain Descriptors / Indicators: Sore Pain Intervention(s): Repositioned;Monitored during session    Home Living Family/patient expects to be discharged to:: Private residence Living Arrangements: Alone   Type of Home: House       Home Layout: Able to live on main level with bedroom/bathroom Home Equipment: None      Prior Function Level of Independence: Independent               Hand Dominance        Extremity/Trunk Assessment        Lower Extremity Assessment Lower Extremity Assessment: Generalized weakness    Cervical / Trunk Assessment Cervical / Trunk Assessment: Normal  Communication   Communication: No difficulties  Cognition Arousal/Alertness: Awake/alert Behavior During Therapy: WFL for tasks assessed/performed Overall Cognitive Status: Within Functional Limits for tasks assessed                                        General Comments      Exercises     Assessment/Plan    PT Assessment Patient needs continued PT services  PT Problem List Decreased strength;Decreased mobility;Decreased knowledge of use of DME;Decreased balance;Decreased coordination;Decreased activity tolerance       PT Treatment Interventions Gait training;DME instruction;Therapeutic exercise;Balance  training;Functional mobility training;Therapeutic activities;Patient/family education (compensation techniques, canalith repositioning)    PT Goals (Current goals can be found in the Care Plan section)  Acute Rehab PT Goals PT Goal Formulation: With patient Time For Goal Achievement: 04/15/21 Potential to Achieve Goals: Good    Frequency Min 4X/week   Barriers to discharge        Co-evaluation               AM-PAC PT "6 Clicks" Mobility  Outcome Measure Help needed turning from your back to your side while in a flat bed without using bedrails?: A Little Help needed moving from lying on your back to sitting on the side of a flat bed without using bedrails?: A Little Help needed moving to and from a bed to a chair (including a wheelchair)?: A Lot Help needed standing up from a chair using your arms (e.g., wheelchair or bedside chair)?: A Lot Help needed to walk in hospital room?: A Lot Help needed climbing 3-5 steps with a railing? : A Lot 6 Click Score: 14    End of Session Equipment Utilized During Treatment: Gait belt Activity Tolerance: Patient tolerated treatment well Patient left: with call bell/phone within reach;in bed;with bed alarm set   PT Visit Diagnosis: Other abnormalities of gait and mobility (R26.89);Unsteadiness on feet (R26.81);BPPV    Time: 0962-8366 PT Time Calculation (min) (ACUTE ONLY): 40 min  Charges:   PT Evaluation $PT Eval Moderate Complexity: 1 Mod PT Treatments $Gait Training: 8-22 mins $Canalith Rep Proc: 8-22 mins       Thomasene Mohair PT, DPT Acute Rehabilitation Services Pager: [phone] Office: [phone]  Maida Sale E 04/01/2021, 4:40 PM

## 2021-04-01 NOTE — Progress Notes (Signed)
Received call from Radiology for critical result from Head CT performed this AM. Result   "Linear hyperdense focus at the inferior aspect of the RIGHT basal ganglia region is most likely chronic benign calcification, but cannot be completely excluded as small acute parenchymal hemorrhage given the lack of prior head CT for comparison. If patient is clinically stable, recommend short-term follow-up head CT (6-12 hours) to ensure stability."  Paged Lorin Glass MD, made aware

## 2021-04-01 NOTE — Progress Notes (Signed)
Pt found sitting on the floor. Alert and oriented *4, vital signs stable. Swelling noted to left eye. Cold applied. Pt assisted back to bed. MD and family notified.  Re-educated on use of call bell for assistance with needs, bed alarm set.

## 2021-04-02 ENCOUNTER — Observation Stay (HOSPITAL_COMMUNITY): Payer: BC Managed Care – PPO

## 2021-04-02 DIAGNOSIS — G4733 Obstructive sleep apnea (adult) (pediatric): Secondary | ICD-10-CM

## 2021-04-02 DIAGNOSIS — E876 Hypokalemia: Secondary | ICD-10-CM | POA: Diagnosis not present

## 2021-04-02 DIAGNOSIS — R112 Nausea with vomiting, unspecified: Secondary | ICD-10-CM

## 2021-04-02 DIAGNOSIS — H8112 Benign paroxysmal vertigo, left ear: Secondary | ICD-10-CM | POA: Diagnosis not present

## 2021-04-02 DIAGNOSIS — H8302 Labyrinthitis, left ear: Secondary | ICD-10-CM | POA: Diagnosis not present

## 2021-04-02 DIAGNOSIS — R42 Dizziness and giddiness: Secondary | ICD-10-CM | POA: Diagnosis not present

## 2021-04-02 LAB — CBC WITH DIFFERENTIAL/PLATELET
Abs Immature Granulocytes: 0.01 10*3/uL (ref 0.00–0.07)
Basophils Absolute: 0.1 10*3/uL (ref 0.0–0.1)
Basophils Relative: 1 %
Eosinophils Absolute: 0.1 10*3/uL (ref 0.0–0.5)
Eosinophils Relative: 1 %
HCT: 37 % (ref 36.0–46.0)
Hemoglobin: 12 g/dL (ref 12.0–15.0)
Immature Granulocytes: 0 %
Lymphocytes Relative: 20 %
Lymphs Abs: 1.2 10*3/uL (ref 0.7–4.0)
MCH: 29.1 pg (ref 26.0–34.0)
MCHC: 32.4 g/dL (ref 30.0–36.0)
MCV: 89.6 fL (ref 80.0–100.0)
Monocytes Absolute: 0.4 10*3/uL (ref 0.1–1.0)
Monocytes Relative: 7 %
Neutro Abs: 4.1 10*3/uL (ref 1.7–7.7)
Neutrophils Relative %: 71 %
Platelets: 212 10*3/uL (ref 150–400)
RBC: 4.13 MIL/uL (ref 3.87–5.11)
RDW: 11.9 % (ref 11.5–15.5)
WBC: 5.8 10*3/uL (ref 4.0–10.5)
nRBC: 0 % (ref 0.0–0.2)

## 2021-04-02 LAB — BASIC METABOLIC PANEL
Anion gap: 7 (ref 5–15)
BUN: 13 mg/dL (ref 8–23)
CO2: 26 mmol/L (ref 22–32)
Calcium: 8.8 mg/dL — ABNORMAL LOW (ref 8.9–10.3)
Chloride: 109 mmol/L (ref 98–111)
Creatinine, Ser: 0.65 mg/dL (ref 0.44–1.00)
GFR, Estimated: >60 mL/min (ref >60)
Glucose, Bld: 70 mg/dL (ref 70–99)
Potassium: 3.2 mmol/L — ABNORMAL LOW (ref 3.5–5.1)
Sodium: 142 mmol/L (ref 135–145)

## 2021-04-02 IMAGING — CT CT HEAD W/O CM
4 series · 16 of 47 positions shown, 18 images · non-contrast
Comparison: Prior head CT [DATE].  Brain MRI [DATE].

CLINICAL DATA: Head trauma, minor, normal mental status. Fall in
room. Black eye. Ongoing dizziness, possible head bleed.

EXAM:
CT HEAD WITHOUT CONTRAST
TECHNIQUE: Contiguous axial images were obtained from the base of the skull
through the vertex without intravenous contrast.

[Series 2: head wo · axial · 0.47mm/px · z∈[-42,+73]mm · 7 of 31 slices shown, 9 images]
[im 4/31  brain]
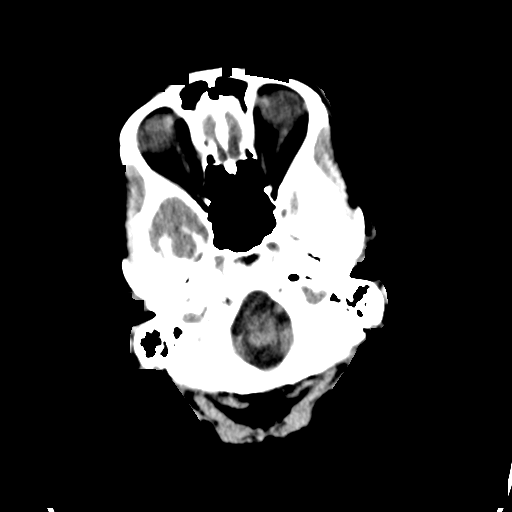
[im 4/31  bone]
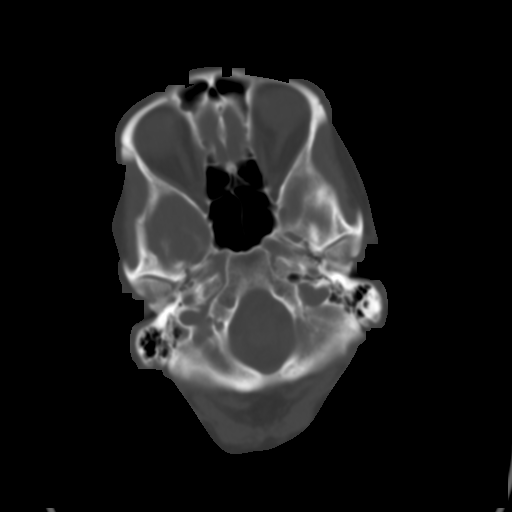
[im 8/31  brain]
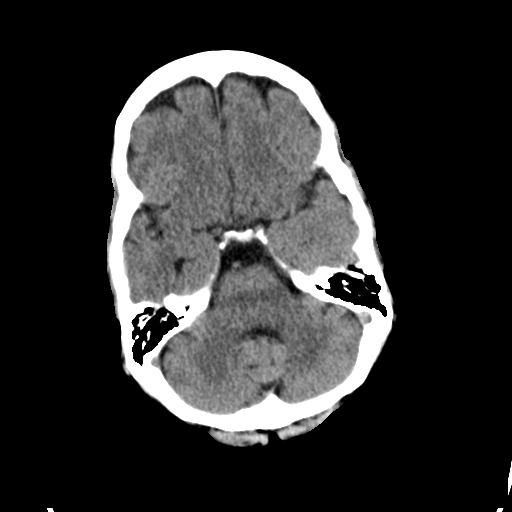
[im 12/31  brain]
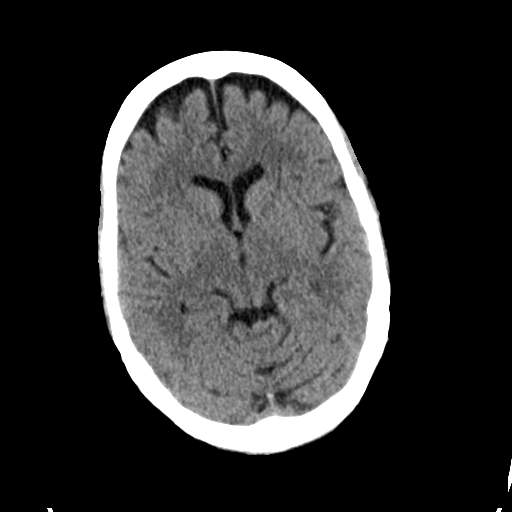
[im 16/31  brain]
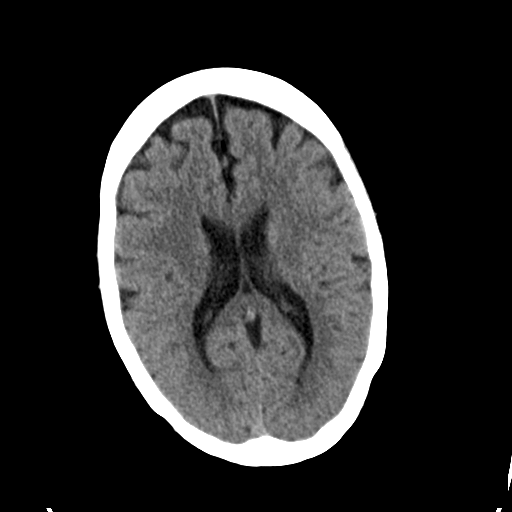
[im 19/31  brain]
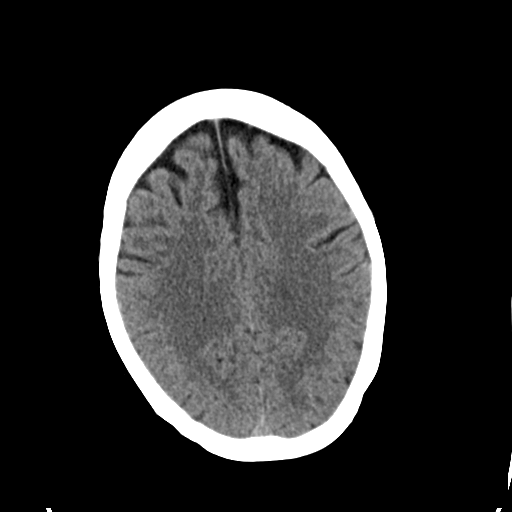
[im 19/31  bone]
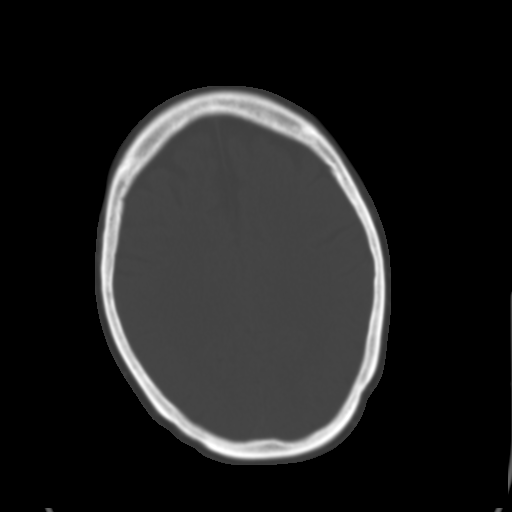
[im 23/31  brain]
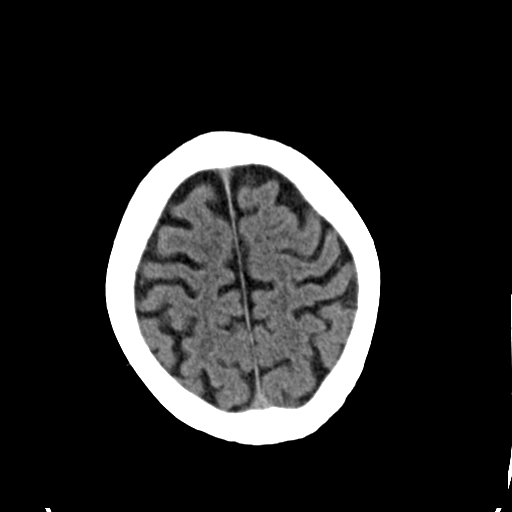
[im 27/31  brain]
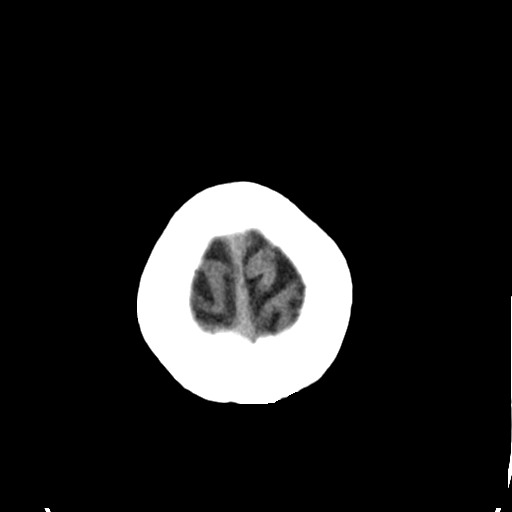

[Series 3: head bone · axial · 0.47mm/px · z∈[-43,-13]mm · 3 of 76 slices shown]
[im 8/76  bone]
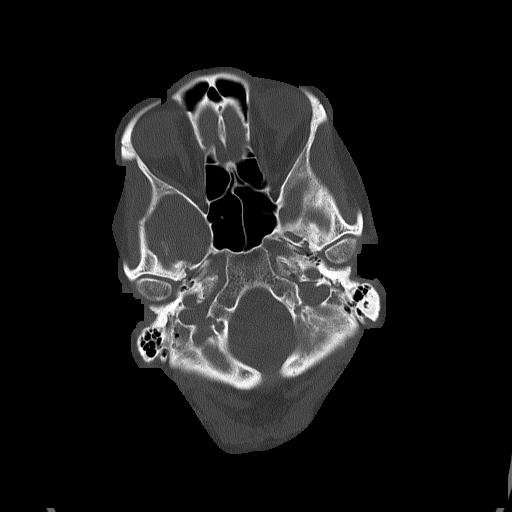
[im 16/76  bone]
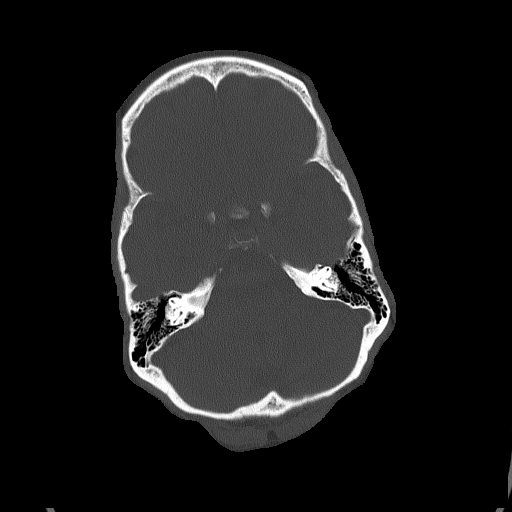
[im 23/76  bone]
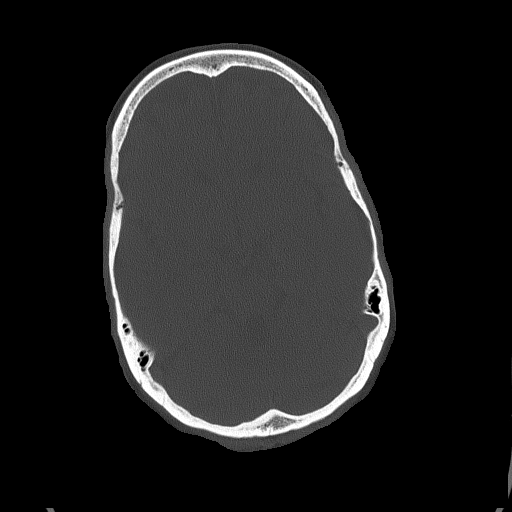

[Series 4: coronal soft tissue · coronal · 0.31mm/px · 3 of 73 slices shown]
[im 25/73  brain]
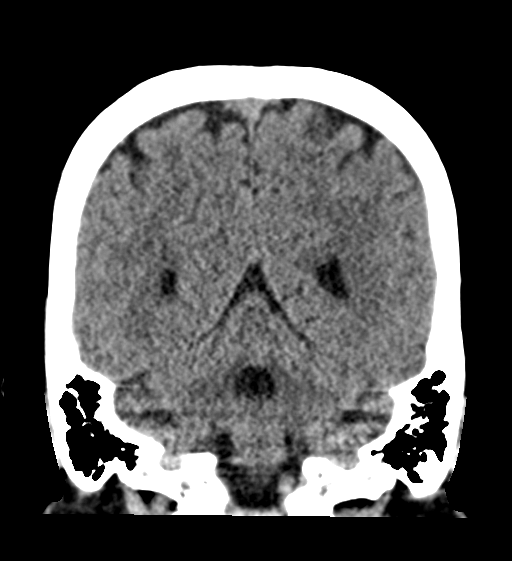
[im 33/73  brain]
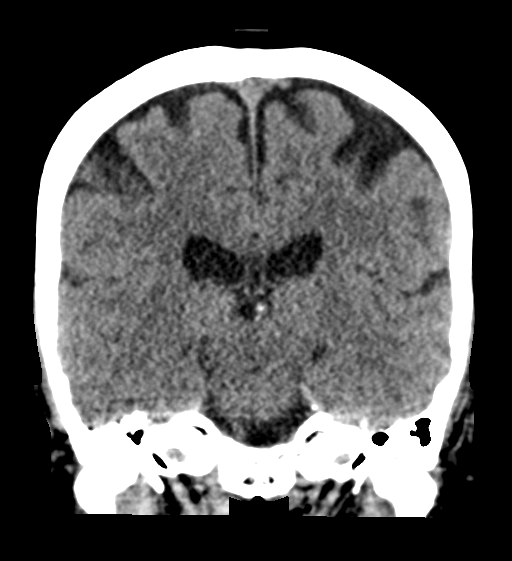
[im 41/73  brain]
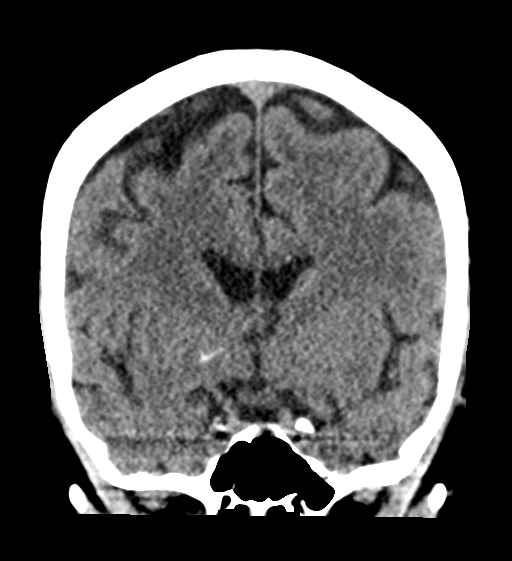

[Series 5: sagittal soft tissue · sagittal · 0.34mm/px · 3 of 53 slices shown]
[im 18/53  brain]
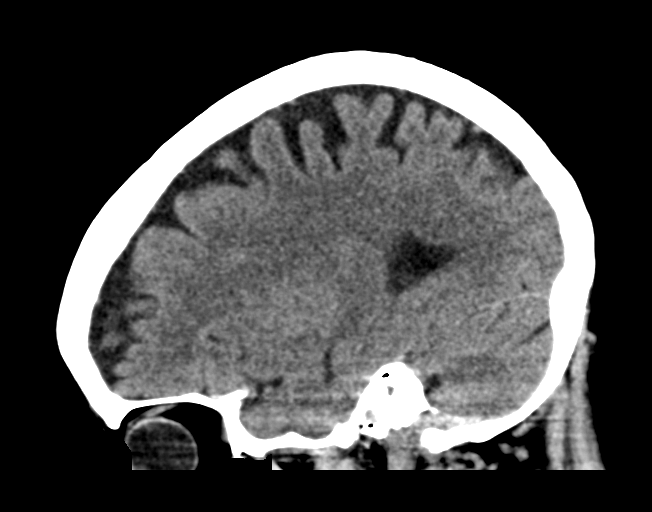
[im 27/53  brain]
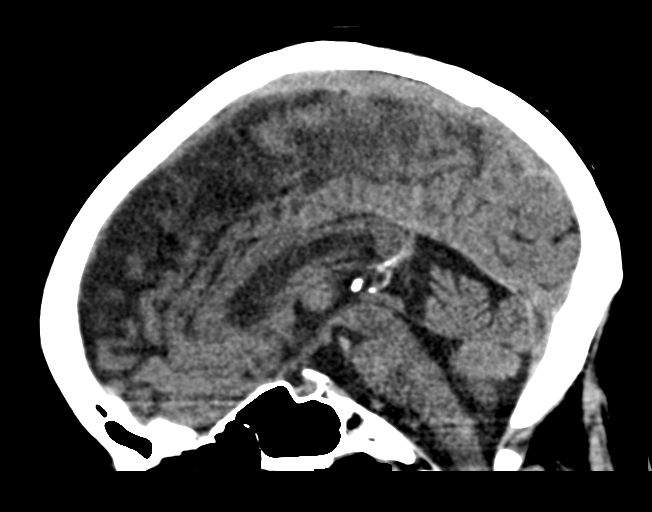
[im 35/53  brain]
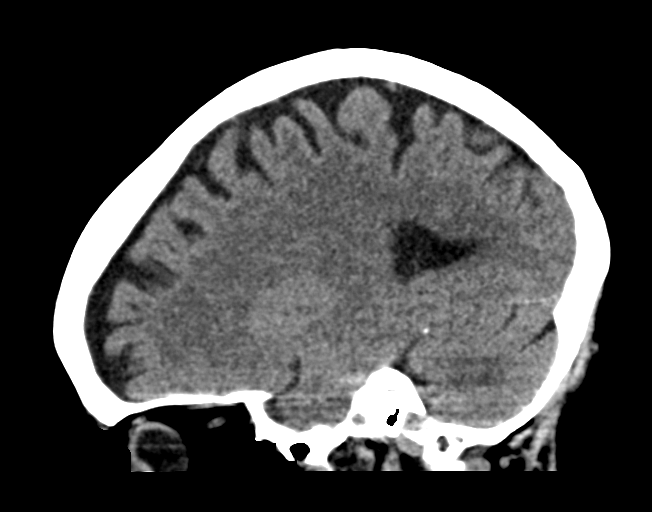

[16 of 47 positions shown; findings below may reference images not displayed]

FINDINGS: Brain:

Cerebral volume is normal.

Unchanged 7 mm linear focus of hyperdensity within the inferior
right basal ganglia (series 4, image 34)

No demarcated cortical infarct.

No extra-axial fluid collection.

No evidence of intracranial mass.

No midline shift.

Vascular: No hyperdense vessel.

Skull: Normal. Negative for fracture or focal lesion.

Sinuses/Orbits: Visualized orbits show no acute finding. No
significant paranasal sinus disease at the imaged levels.
IMPRESSION: 7 mm linear focus of hyperdensity within the inferior right basal
ganglia, unchanged as compared to the head CT of [DATE]. This is
again favored to reflect a focus of linear calcification rather than
acute intracranial hemorrhage.

## 2021-04-02 MED ORDER — POTASSIUM CHLORIDE CRYS ER 20 MEQ PO TBCR
40.0000 meq | EXTENDED_RELEASE_TABLET | ORAL | Status: AC
  Administered 2021-04-02 (×2): 40 meq via ORAL
  Filled 2021-04-02 (×2): qty 2

## 2021-04-02 MED ORDER — ENOXAPARIN SODIUM 40 MG/0.4ML IJ SOSY
40.0000 mg | PREFILLED_SYRINGE | INTRAMUSCULAR | Status: DC
  Administered 2021-04-02: 40 mg via SUBCUTANEOUS
  Filled 2021-04-02 (×2): qty 0.4

## 2021-04-02 NOTE — Consult Note (Signed)
NEUROLOGY CONSULTATION NOTE   Date of service: Apr 02, 2021 Patient Name: Ashley Estes MRN:  [id number] DOB:  10-Dec-1955 Reason for consult: "Vertigo" Requesting Provider: Almon Hercules, MD _ _ _   _ __   _ __ _ _  __ __   _ __   __ _  History of Present Illness  Ashley Estes is a 65 y.o. female with PMH significant for bursitism migraines, osteoporosis, HLD who presents with vertigo.  Woke up on Thursday and was fine. 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 It was persistent, she crawled into her home and called EMS. Has had persistent vertigo since then. It is always there. It is worse with any movement of her head, reading or essentially doing anything.  Workup here with MRI Brain without contrast with no acute abnormalities.   Of note, she attempted to get out of the hospital bed herself and go to the bathroom and had a fall. CTH without contrast with a small R BG calcification, initially thought to be hemorrhage but is stable on repeat CTH.  Denies any hearing loss, no tinnitis, no ear fullness. No prior similar episode. No ear pain or vesicles.   ROS   Constitutional Denies weight loss, fever and chills.   HEENT Denies changes in vision and hearing.   Respiratory Denies SOB and cough.   CV Denies palpitations and CP   GI Denies abdominal pain, nausea, vomiting and diarrhea.   GU Denies dysuria and urinary frequency.   MSK Denies myalgia and joint pain.   Skin Denies rash and pruritus.   Neurological Denies headache and syncope.   Psychiatric Denies recent changes in mood. Denies anxiety and depression.    Past History   Past Medical History:  Diagnosis Date  . Bursitis of shoulder   . Family history of breast cancer   . Family history of cancer   . Hypercholesterolemia   . Migraines   . Osteoporosis   . Periodontal disease    History reviewed. No pertinent surgical history. Family History  Problem Relation Age of  Onset  . Cancer Mother        Dec from lung Ca age 64  . Breast cancer Mother 52  . Thyroid disease Mother   . Ovarian cancer Mother        carcinoma in situ found on TAH-BAO at 34  . Diabetes Father        d. from complications of diabetes at 53  . Cancer Father        lung cancer - metastatic  . Migraines Sister   . Cancer Sister        skin cancer around the anus  . Breast cancer Maternal Aunt        d. 36  . Dementia Paternal Aunt   . Other Paternal Uncle        both uncles died in their 70s  . Dementia Maternal Grandmother   . Other Paternal Grandmother        d. in childbirth  . Other Paternal Grandfather        d. of ear infection  . Breast cancer Other        MGMs sister   Social History   Socioeconomic History  . Marital status: Single    Spouse name: Not on file  . Number of children: Not on file  . Years of education: Not on file  .  Highest education level: Not on file  Occupational History  . Not on file  Tobacco Use  . Smoking status: Never Smoker  . Smokeless tobacco: Never Used  Vaping Use  . Vaping Use: Never used  Substance and Sexual Activity  . Alcohol use: No    Alcohol/week: 0.0 standard drinks  . Drug use: No  . Sexual activity: Not Currently    Partners: Male    Birth control/protection: Post-menopausal  Other Topics Concern  . Not on file  Social History Narrative  . Not on file   Social Determinants of Health   Financial Resource Strain: Not on file  Food Insecurity: Not on file  Transportation Needs: Not on file  Physical Activity: Not on file  Stress: Not on file  Social Connections: Not on file   No Known Allergies  Medications   Medications Prior to Admission  Medication Sig Dispense Refill Last Dose  . aspirin-acetaminophen-caffeine (EXCEDRIN MIGRAINE) 250-250-65 MG per tablet Take 2 tablets by mouth every 6 (six) hours as needed for headache.   03/29/2021  . calcium carbonate (OS-CAL) 600 MG TABS tablet Take 600 mg by  mouth 2 (two) times daily with a meal.   03/31/2021 at Unknown time  . Multiple Vitamins-Minerals (MULTIVITAMIN PO) Take 1 tablet by mouth daily.   03/31/2021 at Unknown time  . OVER THE COUNTER MEDICATION Take 1 tablet by mouth daily as needed (constipation). [facility] laxative tablet - Patient don't know the name   03/28/2021  . pravastatin (PRAVACHOL) 20 MG tablet Take 20 mg by mouth daily.   03/31/2021 at Unknown time  . rizatriptan (MAXALT) 10 MG tablet Take 10 mg by mouth daily as needed for migraine.   03/26/2021  . Tetrahydroz-Dextran-PEG-Povid (VISINE ADVANCED RELIEF OP) Place 1 drop into both eyes daily as needed (redness).   03/29/2021     Vitals   Vitals:   04/01/21 0947 04/01/21 2217 04/02/21 0543 04/02/21 1311  BP: 110/64 123/69 105/63 105/61  Pulse: 60 60 61 61  Resp: 16 16 16 16   Temp: 98.5 F (36.9 C) 98 F (36.7 C) 98 F (36.7 C) 97.8 F (36.6 C)  TempSrc:  Oral Axillary   SpO2: 100% 99% 97% 100%     There is no height or weight on file to calculate BMI.  Physical Exam   General: Laying comfortably in bed; in no acute distress.  HENT: Normal oropharynx and mucosa. Normal external appearance of ears and nose.  Neck: Supple, no pain or tenderness  CV: No JVD. No peripheral edema.  Pulmonary: Symmetric Chest rise. Normal respiratory effort.  Abdomen: Soft to touch, non-tender.  Ext: No cyanosis, edema, or deformity  Skin: No rash. Normal palpation of skin.   Musculoskeletal: Normal digits and nails by inspection. No clubbing.  Neurologic Examination  Mental status/Cognition: Alert, oriented to self, place, month and year, good attention.  Speech/language: Fluent, comprehension intact, object naming intact, repetition intact.  Cranial nerves:   CN II Pupils equal and reactive to light, no VF deficits    CN III,IV,VI EOM intact, no gaze preference or deviation, Gaze evoked nystagmus in Left lateral gaze only.   CN V normal sensation in V1, V2, and V3 segments bilaterally     CN VII no asymmetry, no nasolabial fold flattening    CN VIII normal hearing to speech    CN IX & X normal palatal elevation, no uvular deviation    CN XI 5/5 head turn and 5/5 shoulder shrug bilaterally  CN XII midline tongue protrusion   Weber test: Lateralizes to R ear, consistent with L ear sensorineural hearing loss. Rinnes test: none. Motor:  Muscle bulk: normal, tone normal, pronator drift normal tremor none Mvmt Root Nerve  Muscle Right Left Comments  SA C5/6 Ax Deltoid 5 5   EF C5/6 Mc Biceps 5 5   EE C6/7/8 Rad Triceps 5 5   WF C6/7 Med FCR     WE C7/8 PIN ECU     F Ab C8/T1 U ADM/FDI 5 5   HF L1/2/3 Fem Illopsoas 5 5   KE L2/3/4 Fem Quad     DF L4/5 D Peron Tib Ant 5 5   PF S1/2 Tibial Grc/Sol 5 5    Reflexes:  Right Left Comments  Pectoralis      Biceps (C5/6) 1 1   Brachioradialis (C5/6) 1 1    Triceps (C6/7) 1 1    Patellar (L3/4) 1 1    Achilles (S1)      Hoffman      Plantar     Jaw jerk    Sensation:  Light touch intact   Pin prick    Temperature    Vibration   Proprioception    Coordination/Complex Motor:  - Finger to Nose intact - Heel to shin intact - Rapid alternating movement intact - Gait: deferred.  Labs   CBC:  Recent Labs  Lab 03/31/21 1249 04/01/21 1030 04/02/21 0309  WBC 11.7* 9.5 5.8  NEUTROABS 9.7*  --  4.1  HGB 13.0 11.9* 12.0  HCT 39.5 35.6* 37.0  MCV 88.8 88.8 89.6  PLT 240 216 212    Basic Metabolic Panel:  Lab Results  Component Value Date   NA 142 04/02/2021   K 3.2 (L) 04/02/2021   CO2 26 04/02/2021   GLUCOSE 70 04/02/2021   BUN 13 04/02/2021   CREATININE 0.65 04/02/2021   CALCIUM 8.8 (L) 04/02/2021   GFRNONAA >60 04/02/2021   Lipid Panel: No results found for: LDLCALC HgbA1c:  Lab Results  Component Value Date   HGBA1C 5.7 (H) 04/01/2021   Urine Drug Screen: No results found for: LABOPIA, COCAINSCRNUR, LABBENZ, AMPHETMU, THCU, LABBARB  Alcohol Level No results found for: ETH  CT Head without  contrast: CTH was negative for a large hypodensity concerning for a large territory infarct or hyperdensity concerning for an ICH. R BG calcification, stable on repeat imaging.  MRI Brain: No acute abnormality. Personally reviewed.  Impression   Ashley Estes is a 65 y.o. female with PMH significant for bursitism migraines, osteoporosis, HLD who presents with persistent vertigo that is worsened with any movement. Her neurologic examination is notable for left horizontal gaze evoked nystagmus but no nystagmus in primary or other gaze. With webers testing, lateralizes to the right. Symptoms are most consistent with a labyrithitis.   Recommendations   - Prednisone 60 mg daily on days 1 through 5, 40 mg on day 6, 30 mg on day 7, 20 mg on day 8, 10 mg on day 9, and 5 mg on day 10. - Continue antiemetics and antihistamines for symptomatic vertigo and nausea - recommend outpatient Vestibular rehab. ______________________________________________________________________   Thank you for the opportunity to take part in the care of this patient. If you have any further questions, please contact the neurology consultation attending.  Signed,  Erick Blinks [facility] Pager Number [phone] _ _ _   _ __   _ __ _ _  __ __  _ __   __ _  

## 2021-04-02 NOTE — Progress Notes (Signed)
Physical Therapy Treatment Patient Details Name: Ashley Estes MRN: [id number] DOB: 1956-01-18 Today's Date: 04/02/2021    History of Present Illness 65 y.o. female presented to the ED on 5/12 with complaint of new onset persistent dizziness, nausea, and vomiting. MRI brain shows mild bilateral patchy hyperintensities which were overall felt to be related to underlying migraine disorder.   Pt with fall in hospital and sustained left orbital hematoma also with CT findings of possibility of a small acute parenchymal hemorrhage in the infra aspect of the right basal ganglia.  PMH significant for hyperlipidemia, migraines, OSA    PT Comments    Pt reports finally being able to eat today.  Pt also states MD in earlier and reported pt had labyrinthitis.  Pt politely declined positional testing today but eager to mobilize as she wishes to prepare for d/c home.  Pt also provided with outpatient vestibular PT referral handout in case symptoms persist.   Follow Up Recommendations  Outpatient PT (vestibular)     Equipment Recommendations  Rolling walker with 5" wheels    Recommendations for Other Services       Precautions / Restrictions Precautions Precautions: Fall    Mobility  Bed Mobility Overal bed mobility: Modified Independent                  Transfers Overall transfer level: Needs assistance Equipment used: Rolling walker (2 wheeled) Transfers: Sit to/from Stand Sit to Stand: Min guard         General transfer comment: min/guard for safety  Ambulation/Gait Ambulation/Gait assistance: Min guard Gait Distance (Feet): 360 Feet Assistive device: Rolling walker (2 wheeled) Gait Pattern/deviations: Step-through pattern;Decreased stride length;Narrow base of support     General Gait Details: slow cautious gait, cues for decreasing head/body/eye movements and turning as a unit, cues for focusing on stationary object, continues to report feeling off balance however no  lateral lean observed today   Stairs             Wheelchair Mobility    Modified Rankin (Stroke Patients Only)       Balance                                            Cognition Arousal/Alertness: Awake/alert Behavior During Therapy: WFL for tasks assessed/performed Overall Cognitive Status: Within Functional Limits for tasks assessed                                        Exercises      General Comments        Pertinent Vitals/Pain Pain Assessment: Faces Faces Pain Scale: Hurts little more Pain Location: head Pain Descriptors / Indicators: Sore Pain Intervention(s): Monitored during session    Home Living                      Prior Function            PT Goals (current goals can now be found in the care plan section) Progress towards PT goals: Progressing toward goals    Frequency    Min 4X/week      PT Plan Discharge plan needs to be updated    Co-evaluation  AM-PAC PT "6 Clicks" Mobility   Outcome Measure  Help needed turning from your back to your side while in a flat bed without using bedrails?: A Little Help needed moving from lying on your back to sitting on the side of a flat bed without using bedrails?: A Little Help needed moving to and from a bed to a chair (including a wheelchair)?: A Little Help needed standing up from a chair using your arms (e.g., wheelchair or bedside chair)?: A Little Help needed to walk in hospital room?: A Little Help needed climbing 3-5 steps with a railing? : A Lot 6 Click Score: 17    End of Session Equipment Utilized During Treatment: Gait belt Activity Tolerance: Patient tolerated treatment well Patient left: with call bell/phone within reach;in bed;with nursing/sitter in room (nurse tech to take pt to bathroom)   PT Visit Diagnosis: Other abnormalities of gait and mobility (R26.89);Unsteadiness on feet (R26.81)     Time:  4287-6811 PT Time Calculation (min) (ACUTE ONLY): 18 min  Charges:  $Gait Training: 8-22 mins                     Thomasene Mohair PT, DPT Acute Rehabilitation Services Pager: [phone] Office: [phone]  Maida Sale E 04/02/2021, 3:47 PM

## 2021-04-02 NOTE — Progress Notes (Signed)
PROGRESS NOTE  Ashley Estes:[id number] DOB: [birth year]   PCP: Gaspar Garbe, MD  Patient is from: Home  DOA: 03/31/2021 LOS: 0  Chief complaints: Dizziness and nausea  Brief Narrative / Interim history: 65 year old F with PMH of HLD, migraine and OSA not on CPAP presenting with dizziness, nausea and vomiting for less than 24 hours, and admitted for CVA work-up.  MRI brain without acute finding but mild patchy subcentimeter foci involving the periventricular, deep and subcortical white matter of both cerebral hemispheres consistent with underlying migrainous disorder.  MRA head without LVO or significant stenosis. Neurology consulted and recommended treating for BPPV.  Patient was started on meclizine, scopolamine patch and as needed Imitrex.  Patient had a fall in the hospital with head head to floor on 5/13.  She had bruising about left eye but no changes to her vision or acute focal neuro symptoms.  CT head without contrast reported as possibility of small acute parenchymal hemorrhage in the infra aspect of right BG.  Repeat CT head without contrast the next day showed unchanged 7 mm linear focus of hyperdensity within the inferior right basal ganglia arguing against hemorrhage.   Therapy recommended CIR. CIR consulted for evaluation.  Subjective: Seen and examined earlier this morning.  No major events overnight of this morning.  Continues to endorse dizziness that she describes as spinning and weakness.  Dizziness is worse with rotational movement and sudden change relative to gravity.  She denies further nausea or vomiting.  She denies focal weakness, numbness or tingling.  She denies chest pain, dyspnea  Objective: Vitals:   04/01/21 0947 04/01/21 2217 04/02/21 0543 04/02/21 1311  BP: 110/64 123/69 105/63 105/61  Pulse: 60 60 61 61  Resp: 16 16 16 16   Temp: 98.5 F (36.9 C) 98 F (36.7 C) 98 F (36.7 C) 97.8 F (36.6 C)  TempSrc:  Oral Axillary   SpO2: 100% 99% 97%  100%    Intake/Output Summary (Last 24 hours) at 04/02/2021 1346 Last data filed at 04/02/2021 1000 Gross per 24 hour  Intake 1049.12 ml  Output 1900 ml  Net -850.88 ml   There were no vitals filed for this visit.  Examination:  GENERAL: No apparent distress.  Nontoxic. HEENT: MMM.  Vision and hearing grossly intact.  Bruising about left eye. NECK: Supple.  No apparent JVD.  RESP:  No IWOB.  Fair aeration bilaterally. CVS:  RRR. Heart sounds normal.  ABD/GI/GU: BS+. Abd soft, NTND.  MSK/EXT:  Moves extremities. No apparent deformity. No edema.  SKIN: no apparent skin lesion or wound NEURO: Awake, alert and oriented x4.  CN II-XII intact except for right beating nystagmus in the left eye.  Motor 5/5 in all muscle groups of UE and LE bilaterally, normal tone, light sensation intact in all dermatomes of upper and lower ext bilaterally, no pronator drift, biceps, patellar and Achille reflexes 2+ bilaterally, finger to nose intact, gait deferred. PSYCH: Calm. Normal affect.  Procedures:  None  Microbiology summarized: COVID-19 and influenza PCR nonreactive.  Assessment & Plan: Dizziness/BPPV-CVA work-up unrevealing.  Continues to have dizziness and had a fall in the hospital.  Therapy recommended CIR -CIR consulted. -Continue vestibular PT -Continue p.o. meclizine, scopolamine patch  Fall in the hospital-the morning of 5/13.  Had bruise about left thigh but no vision change or focal neuro symptoms.  CT head without acute finding. -Continue fall precautions -PT/OT Intractable nausea and vomiting-resolved.  Chronic migraine-her symptoms are not typical of a migraine -Continue  Excedrin and as needed Imitrex  OSA not on CPAP-reports difficulty getting CPAP.  Willing to try CPAP tonight -Nightly CPAP -Ambulatory referral to sleep clinic  Hyperlipidemia -Continue home statin  DVT prophylaxis:  enoxaparin (LOVENOX) injection 40 mg Start: 04/02/21 2200  Code Status: Full  code Family Communication: Patient and/or RN. Available if any question.  Level of care: Med-Surg Status is: Observation  The patient remains OBS appropriate and will d/c before 2 midnights.  Dispo: The patient is from: Home              Anticipated d/c is to: CIR              Patient currently is medically stable to d/c.   Difficult to place patient No       Consultants:  Neurology    Sch Meds:  Scheduled Meds: . enoxaparin (LOVENOX) injection  40 mg Subcutaneous Q24H  . scopolamine  1 patch Transdermal Q72H   Continuous Infusions: . sodium chloride 50 mL/hr at 04/02/21 1000  . promethazine (PHENERGAN) injection (IM or IVPB) Stopped (04/01/21 0035)   PRN Meds:.acetaminophen **OR** acetaminophen, aspirin-acetaminophen-caffeine, diphenhydrAMINE, meclizine, promethazine (PHENERGAN) injection (IM or IVPB), SUMAtriptan  Antimicrobials: Anti-infectives (From admission, onward)   None       I have personally reviewed the following labs and images: CBC: Recent Labs  Lab 03/31/21 1249 04/01/21 1030 04/02/21 0309  WBC 11.7* 9.5 5.8  NEUTROABS 9.7*  --  4.1  HGB 13.0 11.9* 12.0  HCT 39.5 35.6* 37.0  MCV 88.8 88.8 89.6  PLT 240 216 212   BMP &GFR Recent Labs  Lab 03/31/21 1249 04/01/21 0327 04/02/21 0309  NA 139 139 142  K 3.9 4.0 3.2*  CL 111 108 109  CO2 22 26 26   GLUCOSE 157* 106* 70  BUN 17 15 13   CREATININE 0.76 0.62 0.65  CALCIUM 7.9* 8.8* 8.8*   CrCl cannot be calculated (Unknown ideal weight.). Liver & Pancreas: No results for input(s): AST, ALT, ALKPHOS, BILITOT, PROT, ALBUMIN in the last 168 hours. No results for input(s): LIPASE, AMYLASE in the last 168 hours. No results for input(s): AMMONIA in the last 168 hours. Diabetic: Recent Labs    04/01/21 1030  HGBA1C 5.7*   No results for input(s): GLUCAP in the last 168 hours. Cardiac Enzymes: No results for input(s): CKTOTAL, CKMB, CKMBINDEX, TROPONINI in the last 168 hours. No results  for input(s): PROBNP in the last 8760 hours. Coagulation Profile: No results for input(s): INR, PROTIME in the last 168 hours. Thyroid Function Tests: No results for input(s): TSH, T4TOTAL, FREET4, T3FREE, THYROIDAB in the last 72 hours. Lipid Profile: No results for input(s): CHOL, HDL, LDLCALC, TRIG, CHOLHDL, LDLDIRECT in the last 72 hours. Anemia Panel: No results for input(s): VITAMINB12, FOLATE, FERRITIN, TIBC, IRON, RETICCTPCT in the last 72 hours. Urine analysis:    Component Value Date/Time   BILIRUBINUR n 03/24/2015 0914   PROTEINUR n 03/24/2015 0914   UROBILINOGEN negative 03/24/2015 0914   NITRITE n 03/24/2015 0914   LEUKOCYTESUR Negative 03/24/2015 0914   Sepsis Labs: Invalid input(s): PROCALCITONIN, LACTICIDVEN  Microbiology: Recent Results (from the past 240 hour(s))  SARS CORONAVIRUS 2 (TAT 6-24 HRS) Nasopharyngeal Nasopharyngeal Swab     Status: None   Collection Time: 03/31/21  9:28 PM   Specimen: Nasopharyngeal Swab  Result Value Ref Range Status   SARS Coronavirus 2 NEGATIVE NEGATIVE Final    Comment: (NOTE) SARS-CoV-2 target nucleic acids are NOT DETECTED.  The SARS-CoV-2 RNA is  generally detectable in upper and lower respiratory specimens during the acute phase of infection. Negative results do not preclude SARS-CoV-2 infection, do not rule out co-infections with other pathogens, and should not be used as the sole basis for treatment or other patient management decisions. Negative results must be combined with clinical observations, patient history, and epidemiological information. The expected result is Negative.  Fact Sheet for Patients: HairSlick.no  Fact Sheet for Healthcare Providers: quierodirigir.com  This test is not yet approved or cleared by the [place] FDA and  has been authorized for detection and/or diagnosis of SARS-CoV-2 by FDA under an Emergency Use Authorization (EUA). This  EUA will remain  in effect (meaning this test can be used) for the duration of the COVID-19 declaration under Se ction 564(b)(1) of the Act, 21 U.S.C. section 360bbb-3(b)(1), unless the authorization is terminated or revoked sooner.  Performed at [facility], [address], [place], [place] [address]     Radiology Studies: CT HEAD WO CONTRAST  Result Date: 04/02/2021 CLINICAL DATA:  Head trauma, minor, normal mental status. Fall in room. Black eye. Ongoing dizziness, possible head bleed. EXAM: CT HEAD WITHOUT CONTRAST TECHNIQUE: Contiguous axial images were obtained from the base of the skull through the vertex without intravenous contrast. COMPARISON:  Prior head CT 04/01/2021.  Brain MRI 03/31/2021. FINDINGS: Brain: Cerebral volume is normal. Unchanged 7 mm linear focus of hyperdensity within the inferior right basal ganglia (series 4, image 34) No demarcated cortical infarct. No extra-axial fluid collection. No evidence of intracranial mass. No midline shift. Vascular: No hyperdense vessel. Skull: Normal. Negative for fracture or focal lesion. Sinuses/Orbits: Visualized orbits show no acute finding. No significant paranasal sinus disease at the imaged levels. IMPRESSION: 7 mm linear focus of hyperdensity within the inferior right basal ganglia, unchanged as compared to the head CT of 04/01/2021. This is again favored to reflect a focus of linear calcification rather than acute intracranial hemorrhage. Electronically Signed   By: Jackey Loge DO   On: 04/02/2021 12:17     Jaidan Stachnik T. Judson Tsan [facility]  If 7PM-7AM, please contact night-coverage [email or web address] 04/02/2021, 1:46 PM

## 2021-04-02 NOTE — Progress Notes (Signed)
Transported patient down for CT scan.

## 2021-04-02 NOTE — Progress Notes (Signed)
Patient back from CT scan.

## 2021-04-02 NOTE — Progress Notes (Signed)
Inpatient Rehab Admissions Coordinator:  Consult received. Note pt is under observation status at this time. Pt may not have the medical necessity to warrant an inpatient rehab stay if they remain observation. If status were to change to inpatient, AC will assess for candidacy.   Marla Pouliot Graves Madden, MS, CCC-SLP Admissions Coordinator [phone]  

## 2021-04-02 NOTE — Plan of Care (Signed)
  Problem: Education: Goal: Knowledge of General Education information will improve Description: Including pain rating scale, medication(s)/side effects and non-pharmacologic comfort measures Outcome: Progressing   Problem: Health Behavior/Discharge Planning: Goal: Ability to manage health-related needs will improve Outcome: Progressing   Problem: Activity: Goal: Risk for activity intolerance will decrease Outcome: Progressing   

## 2021-04-02 NOTE — Progress Notes (Deleted)
Neurology Progress Note  S: Patient reported a fall going to the bathroom. States she fell onto her face and she has a "black eye" left. She still has some dizziness, but has improved. Her dizziness basically happens when she moves her head or changes positions. She is presently getting vestibular therapy. She admits to blurry vision when she is dizzy, but no other time.   She denies HA, weakness of an extremity, numbness, tingling, dysarthria, or cognitive issues. No HA.   There was a small area on CTH that was commented as being hemorrhagic area. However, MRI does not support this and Dr. Ezzie Dural independently read both images.   Noted r/p head CT for this am and that is secondary to her fall.   O: Current vital signs: BP 105/63 (BP Location: Left Arm)   Pulse 61   Temp 98 F (36.7 C) (Axillary)   Resp 16   LMP 11/20/2006 (Approximate)   SpO2 97%  Vital signs in last 24 hours: Temp:  [98 F (36.7 C)] 98 F (36.7 C) (05/14 0543) Pulse Rate:  [60-61] 61 (05/14 0543) Resp:  [16] 16 (05/14 0543) BP: (105-123)/(63-69) 105/63 (05/14 0543) SpO2:  [97 %-99 %] 97 % (05/14 0543)  GENERAL: Awake, alert in NAD HEENT: Normocephalic and atraumatic. Purplish discoloration to left periorbital area.  LUNGS: Normal respiratory effort.  CV: RRR ABDOMEN: Soft, nontender Ext: warm Psych: affect light.   NEURO:  Mental Status: AA&Ox3. Follows commands without delay.  Speech/Language: speech is without aphasia or dysarthria.  Naming, repetition, fluency, and comprehension intact.  Cranial Nerves:  II: PERRL. Visual fields full.  III, IV, VI:  Eyelids elevate symmetrically.  V: Sensation is intact to light touch and symmetrical to face.  VII: Smile is symmetrical.  VIII: hearing intact to voice. QB:HALPFXTK shrug 5/5. XII: tongue is midline without fasciculations. Motor: 5/5 strength to all muscle groups tested.  Tone: is normal and bulk is normal Sensation- Intact to light touch bilaterally.  Extinction absent to light touch to DSS.    Coordination: FTN intact bilaterally Gait- deferred  Medications  Current Facility-Administered Medications:  .  0.9 %  sodium chloride infusion, , Intravenous, Continuous, Dahal, Binaya, MD, Last Rate: 50 mL/hr at 04/01/21 1321, New Bag at 04/01/21 1321 .  acetaminophen (TYLENOL) tablet 650 mg, 650 mg, Oral, Q6H PRN **OR** acetaminophen (TYLENOL) suppository 650 mg, 650 mg, Rectal, Q6H PRN, Allena Katz, Vishal R, MD .  aspirin-acetaminophen-caffeine (EXCEDRIN MIGRAINE) per tablet 2 tablet, 2 tablet, Oral, Q6H PRN, Dahal, Binaya, MD .  diazepam (VALIUM) tablet 5 mg, 5 mg, Oral, Once, Gerhard Munch, MD .  diphenhydrAMINE (BENADRYL) injection 12.5 mg, 12.5 mg, Intravenous, Q6H PRN, Darreld Mclean R, MD .  meclizine (ANTIVERT) tablet 25 mg, 25 mg, Oral, TID PRN, Darreld Mclean R, MD, 25 mg at 04/01/21 1324 .  potassium chloride SA (KLOR-CON) CR tablet 40 mEq, 40 mEq, Oral, Q4H, Gonfa, Taye T, MD, 40 mEq at 04/02/21 0749 .  promethazine (PHENERGAN) 12.5 mg in sodium chloride 0.9 % 50 mL IVPB, 12.5 mg, Intravenous, Q6H PRN, Charlsie Quest, MD, Stopped at 04/01/21 0035 .  scopolamine (TRANSDERM-SCOP) 1 MG/3DAYS 1.5 mg, 1 patch, Transdermal, Q72H, Gerhard Munch, MD, 1.5 mg at 04/01/21 0011 .  SUMAtriptan (IMITREX) tablet 100 mg, 100 mg, Oral, Q2H PRN, Dahal, Binaya, MD  Imaging MD has reviewed images in epic and the results pertinent to this consultation are:  CT Head 7 mm linear focus of hyperdensity within the inferior right basal ganglia,  unchanged as compared to the head CT of 04/01/2021. This is again favored to reflect a focus of linear calcification rather than acute intracranial hemorrhage.  MRI/MRA Brain  1. No acute intracranial abnormality. 2. Mild patchy subcentimeter foci of T2/FLAIR hyperintensity involving the periventricular, deep, and subcortical white matter of both cerebral hemispheres, overall mild in nature. Findings  are nonspecific, but favored to be related to underlying migrainous disorder given patient history of migraines. Sequelae of chronic microvascular ischemic disease would be the primary differential consideration. Normal intracranial MRA. No large vessel occlusion, hemodynamically significant stenosis, or other acute vascular abnormality.  Assessment: 65 yo female who was admitted on 03/31/21 for dizziness, vertigo, and giddiness. Neurology tele exam documented as likely BPPV. Patient with a history of MHA, but per MAR, no HA medication has been taken over past 24 hours, and she denies HA today. The hyperdensity within the inferior right basal ganglia seen on first CTH, is thought to be linear calcification. MRI brain did not support a hemorrhage when read by neurologist. 2nd CTH today post fall reads that area of hyperdensity is unchanged and favored again to be calcification rather than hemorrhage. Therefore, stroke is ruled out and favor this to be BPPV.   Plan:  1. Continue meds per hospitalist for BPPV.  2. Agree with vestibular therapy.  3. Suggest ENT consult should symptoms not improve.   4. MRI/CT findings do not exclude use of ASA 81mg  or NSAIDs should patient need to be on either medication.   Pt seen by , MSN, APN-BC/Nurse Practitioner/Neuro and later by MD. Note and plan to be edited as needed by MD.  Pager: Jimmye Norman

## 2021-04-03 DIAGNOSIS — H8112 Benign paroxysmal vertigo, left ear: Secondary | ICD-10-CM | POA: Diagnosis not present

## 2021-04-03 DIAGNOSIS — G4733 Obstructive sleep apnea (adult) (pediatric): Secondary | ICD-10-CM | POA: Diagnosis not present

## 2021-04-03 DIAGNOSIS — H8302 Labyrinthitis, left ear: Secondary | ICD-10-CM

## 2021-04-03 LAB — RENAL FUNCTION PANEL
Albumin: 3.5 g/dL (ref 3.5–5.0)
Anion gap: 1 — ABNORMAL LOW (ref 5–15)
BUN: 19 mg/dL (ref 8–23)
CO2: 27 mmol/L (ref 22–32)
Calcium: 8.9 mg/dL (ref 8.9–10.3)
Chloride: 114 mmol/L — ABNORMAL HIGH (ref 98–111)
Creatinine, Ser: 0.71 mg/dL (ref 0.44–1.00)
GFR, Estimated: >60 mL/min (ref >60)
Glucose, Bld: 93 mg/dL (ref 70–99)
Phosphorus: 2.2 mg/dL — ABNORMAL LOW (ref 2.5–4.6)
Potassium: 5.3 mmol/L — ABNORMAL HIGH (ref 3.5–5.1)
Sodium: 142 mmol/L (ref 135–145)

## 2021-04-03 LAB — MAGNESIUM: Magnesium: 2.1 mg/dL (ref 1.7–2.4)

## 2021-04-03 MED ORDER — SODIUM CHLORIDE 0.9 % IV SOLN: INTRAVENOUS | Status: DC

## 2021-04-03 MED ORDER — PREDNISONE 10 MG PO TABS: ORAL_TABLET | ORAL | 0 refills | Status: DC

## 2021-04-03 MED ORDER — ONDANSETRON HCL 4 MG PO TABS: 4.0000 mg | ORAL_TABLET | Freq: Three times a day (TID) | ORAL | 0 refills | Status: DC | PRN

## 2021-04-03 MED ORDER — PREDNISONE 20 MG PO TABS
60.0000 mg | ORAL_TABLET | Freq: Every day | ORAL | Status: DC
  Administered 2021-04-03: 60 mg via ORAL
  Filled 2021-04-03: qty 3

## 2021-04-03 MED ORDER — MECLIZINE HCL 25 MG PO TABS: 25.0000 mg | ORAL_TABLET | Freq: Three times a day (TID) | ORAL | 0 refills | Status: DC | PRN

## 2021-04-03 NOTE — Plan of Care (Signed)
Reviewed written d/c instructions w pt and all questions answered. Pt verbalized understanding. Awaiting ride home. 

## 2021-04-03 NOTE — Progress Notes (Addendum)
PT is recommending outpt vestibular rehab and a RW. Met with pt. She lives alone. She plans to return home with the support of her neighbor. She reports that her neighbor is very supportive and he is taking her home today. Discussed PT recommendations. She agrees with outpt vestibular rehab. She reports that she will call an [facility] Popper to go to her rehab appointments. Contacted [facility] for DME referral, but they did not respond. Contacted Jermaine with [employer] and he accepted the referral.

## 2021-04-03 NOTE — Discharge Summary (Signed)
Physician Discharge Summary  VASHON ARCH ZOX:[id number] DOB: 06-Sep-1956 DOA: 03/31/2021  PCP: Gaspar Garbe, MD  Admit date: 03/31/2021 Discharge date: 04/03/2021  Admitted From: Home Disposition: Home  Recommendations for Outpatient Follow-up:  1. Follow ups as below. 2. Please obtain CBC/BMP/Mag at follow up 3. Please follow up on the following pending results: None  Home Health: Ambulatory referral to vestibular PT Equipment/Devices: Rolling walker  Discharge Condition: Stable CODE STATUS: Full code   Follow-up Information    Tisovec, Adelfa Koh, MD. Schedule an appointment as soon as possible for a visit in 1 week(s).   Specialty: Internal Medicine Contact information: [address] [place] [address] [phone]        [facility].   Specialty: Rehabilitation Why: Someone from the rehabilitation facility will be in contact with you after discharge to arrange your first appointment or you can call them.  Contact information: [address] [id number] mc [place] [address] [phone]              Hospital Course: 65 year old F with PMH of HLD, migraine and OSA not on CPAP presenting with dizziness, nausea and vomiting for less than 24 hours, and admitted for CVA work-up.  MRI brain without acute finding but mild patchy subcentimeter foci involving the periventricular, deep and subcortical white matter of both cerebral hemispheres consistent with underlying migrainous disorder.  MRA head without LVO or significant stenosis. Neurology consulted and recommended treating for BPPV.  Patient was started on meclizine, scopolamine patch and as needed Imitrex.  Patient had a fall in the hospital with head head to floor on 5/13.  She had bruising about left eye but no changes to her vision or acute focal neuro symptoms.  CT head without contrast reported as possibility of small  acute parenchymal hemorrhage in the infra aspect of right BG.  Repeat CT head without contrast the next day showed unchanged 7 mm linear focus of hyperdensity within the inferior right basal ganglia arguing against hemorrhage.  On further evaluation by neurology, concern for labyrinthitis.  Neurology recommended discharging on prednisone taper as below.  Therapy initially recommended CIR but patient symptoms improved and changed recommendation.  Outpatient vestibular PT.   See individual problem list below for more on hospital course.  Discharge Diagnoses:  Vertigo-concern about labyrinthitis vs BPPV. CVA work-up unrevealing.  Vertigo improved.  Neurology recommended starting prednisone.  Therapy change recommendation from CIR to outpatient vestibular PT. -Discharge on prednisone 60 mg daily for a total of 6 days, then taper down by 10 mg daily after that -Ambulatory referral to vestibular PT ordered. -Rolling walker ordered. -P.o. meclizine and p.o. Zofran as needed  Fall in the hospital-the morning of 5/13.  Had bruise about left thigh but no vision change or focal neuro symptoms.  CT head without acute finding. -Management as above  Intractable nausea and vomiting-resolved. -Zofran as needed  Chronic migraine-her symptoms are not typical of a migraine -Continue home medications.  OSA not on CPAP-reports difficulty getting CPAP from her sleep clinic.  She did well with CPAP in house. -Ambulatory referral to sleep clinic ordered  Hyperlipidemia -Continue home statin   There is no height or weight on file to calculate BMI.            Discharge Exam: Vitals:   04/02/21 2036 04/03/21 0528  BP: (!) 108/58 (!) 102/49  Pulse: 84 (!) 52  Resp: 15 15  Temp: 99.7 F (37.6 C)  98.5 F (36.9 C)  SpO2: 99% 100%    GENERAL: No apparent distress.  Nontoxic. HEENT: MMM.  Vision and hearing grossly intact.  NECK: Supple.  No apparent JVD.  RESP: On RA.  No IWOB.  Fair  aeration bilaterally. CVS:  RRR. Heart sounds normal.  ABD/GI/GU: Bowel sounds present. Soft. Non tender.  MSK/EXT:  Moves extremities. No apparent deformity. No edema.  SKIN: no apparent skin lesion or wound NEURO: Awake, alert and oriented appropriately.  No apparent focal neuro deficit. PSYCH: Calm. Normal affect.   Discharge Instructions  Discharge Instructions    Ambulatory referral to Physical Therapy   Complete by: As directed    MD is recommending vestibular rehab.   Ambulatory referral to Sleep Studies   Complete by: As directed    Patient reports a diagnosis of sleep apnea but couldn't get CPAP   Call MD for:  persistant dizziness or light-headedness   Complete by: As directed    Call MD for:  persistant nausea and vomiting   Complete by: As directed    Diet general   Complete by: As directed    Discharge instructions   Complete by: As directed    It has been a pleasure taking care of you!  You were hospitalized with dizziness, nausea and vomiting.  Nausea and vomiting seems to have resolved.  We have started you on prednisone for possible labyrinthitis.  We have also sent outpatient referral for vestibular therapy.  You may try meclizine as needed for dizziness.  You can also try Zofran as needed for nausea and vomiting.  Please follow-up with your primary care doctor 1 to 2 weeks.  Review your new medication list and the directions on your medications before you take them.   Take care,   Increase activity slowly   Complete by: As directed      Allergies as of 04/03/2021   No Known Allergies     Medication List    TAKE these medications   aspirin-acetaminophen-caffeine 250-250-65 MG tablet Commonly known as: EXCEDRIN MIGRAINE Take 2 tablets by mouth every 6 (six) hours as needed for headache.   calcium carbonate 600 MG Tabs tablet Commonly known as: OS-CAL Take 600 mg by mouth 2 (two) times daily with a meal.   meclizine 25 MG tablet Commonly known as:  ANTIVERT Take 1 tablet (25 mg total) by mouth 3 (three) times daily as needed for dizziness.   MULTIVITAMIN PO Take 1 tablet by mouth daily.   ondansetron 4 MG tablet Commonly known as: Zofran Take 1 tablet (4 mg total) by mouth every 8 (eight) hours as needed for up to 15 doses for nausea or vomiting.   OVER THE COUNTER MEDICATION Take 1 tablet by mouth daily as needed (constipation). CVS laxative tablet - Patient don't know the name   pravastatin 20 MG tablet Commonly known as: PRAVACHOL Take 20 mg by mouth daily.   predniSONE 10 MG tablet Commonly known as: DELTASONE Chronic 6 tablets (60 mg) daily for 5 days, then cut down by 10 mg daily. Finish course with 5 mg daily for two days   rizatriptan 10 MG tablet Commonly known as: MAXALT Take 10 mg by mouth daily as needed for migraine.   VISINE ADVANCED RELIEF OP Place 1 drop into both eyes daily as needed (redness).            Durable Medical Equipment  (From admission, onward)         Start  Ordered   04/03/21 0947  DME Walker  Once       Question Answer Comment  Walker: With 5 Inch Wheels   Patient needs a walker to treat with the following condition Unsteady gait      04/03/21 0949          Consultations:  Neurology  Procedures/Studies:   CT HEAD WO CONTRAST  Result Date: 04/02/2021 CLINICAL DATA:  Head trauma, minor, normal mental status. Fall in room. Black eye. Ongoing dizziness, possible head bleed. EXAM: CT HEAD WITHOUT CONTRAST TECHNIQUE: Contiguous axial images were obtained from the base of the skull through the vertex without intravenous contrast. COMPARISON:  Prior head CT 04/01/2021.  Brain MRI 03/31/2021. FINDINGS: Brain: Cerebral volume is normal. Unchanged 7 mm linear focus of hyperdensity within the inferior right basal ganglia (series 4, image 34) No demarcated cortical infarct. No extra-axial fluid collection. No evidence of intracranial mass. No midline shift. Vascular: No hyperdense  vessel. Skull: Normal. Negative for fracture or focal lesion. Sinuses/Orbits: Visualized orbits show no acute finding. No significant paranasal sinus disease at the imaged levels. IMPRESSION: 7 mm linear focus of hyperdensity within the inferior right basal ganglia, unchanged as compared to the head CT of 04/01/2021. This is again favored to reflect a focus of linear calcification rather than acute intracranial hemorrhage. Electronically Signed   By: Jackey Loge DO   On: 04/02/2021 12:17   CT HEAD WO CONTRAST  Result Date: 04/01/2021 CLINICAL DATA:  Orbital trauma, LEFT orbital hematoma. EXAM: CT HEAD WITHOUT CONTRAST TECHNIQUE: Contiguous axial images were obtained from the base of the skull through the vertex without intravenous contrast. COMPARISON:  None. FINDINGS: Brain: Ventricles are normal in size and configuration. No extra-axial hemorrhage. Linear hyperdense focus at the inferior RIGHT basal ganglia region is most likely chronic calcifications, but cannot be completely excluded as small acute parenchymal hemorrhage. No other possible site of parenchymal hemorrhage. No evidence of parenchymal mass or edema. Vascular: No hyperdense vessel or unexpected calcification. Skull: Normal. Negative for fracture or focal lesion. Sinuses/Orbits: Visualized upper periorbital and retro-orbital soft tissues are unremarkable. Visualized upper paranasal sinuses are clear. Other: Soft tissue edema overlying the lateral aspects of the LEFT orbit. No underlying fracture is seen in this area. IMPRESSION: 1. Linear hyperdense focus at the inferior aspect of the RIGHT basal ganglia region is most likely chronic benign calcification, but cannot be completely excluded as small acute parenchymal hemorrhage given the lack of prior head CT for comparison. If patient is clinically stable, recommend short-term follow-up head CT (6-12 hours) to ensure stability. 2. No evidence of intracranial hemorrhage elsewhere. No intracranial  mass, edema or mass effect. 3. Soft tissue edema overlying the lateral aspects of the LEFT orbit. No underlying fracture. These results will be called to the ordering clinician or representative by the Radiologist Assistant, and communication documented in the PACS or [employer]. Electronically Signed   By: Bary Richard M.D.   On: 04/01/2021 12:19   MR ANGIO HEAD WO CONTRAST  Result Date: 03/31/2021 CLINICAL DATA:  Initial evaluation for acute dizziness. EXAM: MRI HEAD WITHOUT CONTRAST MRA HEAD WITHOUT CONTRAST TECHNIQUE: Multiplanar, multi-echo pulse sequences of the brain and surrounding structures were acquired without intravenous contrast. Angiographic images of the Circle of Willis were acquired using MRA technique without intravenous contrast. COMPARISON: No pertinent prior exam. COMPARISON:  None available. FINDINGS: MRI HEAD FINDINGS Brain: Cerebral volume within normal limits. Scattered patchy subcentimeter foci of T2/FLAIR hyperintensity noted involving the periventricular,  deep, and subcortical white matter both cerebral hemispheres, most pronounced at the frontal lobes bilaterally. Findings are nonspecific, but overall mild in nature. No abnormal foci of restricted diffusion to suggest acute or subacute ischemia. Gray-white matter differentiation maintained. No encephalomalacia to suggest chronic cortical infarction. No evidence for acute or chronic intracranial hemorrhage. No mass lesion, midline shift or mass effect. No hydrocephalus or extra-axial fluid collection. Pituitary gland and suprasellar region within normal limits. Midline structures intact. Vascular: Major intracranial vascular flow voids are maintained. Skull and upper cervical spine: Craniocervical junction within normal limits. Bone marrow signal intensity normal. No scalp soft tissue abnormality. Sinuses/Orbits: Globes and orbital soft tissues demonstrate no acute finding. Paranasal sinuses are clear. No mastoid effusion.  Inner ear structures grossly normal. Other: None. MRA HEAD FINDINGS ANTERIOR CIRCULATION: Visualized distal cervical segments of both internal carotid arteries are widely patent with antegrade flow. Petrous, cavernous, and supraclinoid segments patent without stenosis or other abnormality. A1 segments patent bilaterally. Normal anterior communicating artery complex. Anterior cerebral arteries patent to their distal aspects without stenosis. No M1 stenosis or occlusion. Normal MCA bifurcations. Distal MCA branches well perfused and symmetric. POSTERIOR CIRCULATION: Both V4 segments widely patent to the vertebrobasilar junction. Left vertebral artery dominant. Both PICA origins patent and normal. Basilar patent to its distal aspect without stenosis. Superior cerebellar arteries patent bilaterally. Both PCA supplied via the basilar as well as prominent bilateral posterior communicating arteries. Both PCAs well perfused to their distal aspects without stenosis. No intracranial aneurysm. IMPRESSION: MRI HEAD IMPRESSION: 1. No acute intracranial abnormality. 2. Mild patchy subcentimeter foci of T2/FLAIR hyperintensity involving the periventricular, deep, and subcortical white matter of both cerebral hemispheres, overall mild in nature. Findings are nonspecific, but favored to be related to underlying migrainous disorder given patient history of migraines. Sequelae of chronic microvascular ischemic disease would be the primary differential consideration. MRA HEAD IMPRESSION: Normal intracranial MRA. No large vessel occlusion, hemodynamically significant stenosis, or other acute vascular abnormality. Electronically Signed   By: Rise Mu M.D.   On: 03/31/2021 19:25   MR BRAIN WO CONTRAST  Result Date: 03/31/2021 CLINICAL DATA:  Initial evaluation for acute dizziness. EXAM: MRI HEAD WITHOUT CONTRAST MRA HEAD WITHOUT CONTRAST TECHNIQUE: Multiplanar, multi-echo pulse sequences of the brain and surrounding  structures were acquired without intravenous contrast. Angiographic images of the Circle of Willis were acquired using MRA technique without intravenous contrast. COMPARISON: No pertinent prior exam. COMPARISON:  None available. FINDINGS: MRI HEAD FINDINGS Brain: Cerebral volume within normal limits. Scattered patchy subcentimeter foci of T2/FLAIR hyperintensity noted involving the periventricular, deep, and subcortical white matter both cerebral hemispheres, most pronounced at the frontal lobes bilaterally. Findings are nonspecific, but overall mild in nature. No abnormal foci of restricted diffusion to suggest acute or subacute ischemia. Gray-white matter differentiation maintained. No encephalomalacia to suggest chronic cortical infarction. No evidence for acute or chronic intracranial hemorrhage. No mass lesion, midline shift or mass effect. No hydrocephalus or extra-axial fluid collection. Pituitary gland and suprasellar region within normal limits. Midline structures intact. Vascular: Major intracranial vascular flow voids are maintained. Skull and upper cervical spine: Craniocervical junction within normal limits. Bone marrow signal intensity normal. No scalp soft tissue abnormality. Sinuses/Orbits: Globes and orbital soft tissues demonstrate no acute finding. Paranasal sinuses are clear. No mastoid effusion. Inner ear structures grossly normal. Other: None. MRA HEAD FINDINGS ANTERIOR CIRCULATION: Visualized distal cervical segments of both internal carotid arteries are widely patent with antegrade flow. Petrous, cavernous, and supraclinoid segments patent without stenosis  or other abnormality. A1 segments patent bilaterally. Normal anterior communicating artery complex. Anterior cerebral arteries patent to their distal aspects without stenosis. No M1 stenosis or occlusion. Normal MCA bifurcations. Distal MCA branches well perfused and symmetric. POSTERIOR CIRCULATION: Both V4 segments widely patent to the  vertebrobasilar junction. Left vertebral artery dominant. Both PICA origins patent and normal. Basilar patent to its distal aspect without stenosis. Superior cerebellar arteries patent bilaterally. Both PCA supplied via the basilar as well as prominent bilateral posterior communicating arteries. Both PCAs well perfused to their distal aspects without stenosis. No intracranial aneurysm. IMPRESSION: MRI HEAD IMPRESSION: 1. No acute intracranial abnormality. 2. Mild patchy subcentimeter foci of T2/FLAIR hyperintensity involving the periventricular, deep, and subcortical white matter of both cerebral hemispheres, overall mild in nature. Findings are nonspecific, but favored to be related to underlying migrainous disorder given patient history of migraines. Sequelae of chronic microvascular ischemic disease would be the primary differential consideration. MRA HEAD IMPRESSION: Normal intracranial MRA. No large vessel occlusion, hemodynamically significant stenosis, or other acute vascular abnormality. Electronically Signed   By: Rise Mu M.D.   On: 03/31/2021 19:25        The results of significant diagnostics from this hospitalization (including imaging, microbiology, ancillary and laboratory) are listed below for reference.     Microbiology: Recent Results (from the past 240 hour(s))  SARS CORONAVIRUS 2 (TAT 6-24 HRS) Nasopharyngeal Nasopharyngeal Swab     Status: None   Collection Time: 03/31/21  9:28 PM   Specimen: Nasopharyngeal Swab  Result Value Ref Range Status   SARS Coronavirus 2 NEGATIVE NEGATIVE Final    Comment: (NOTE) SARS-CoV-2 target nucleic acids are NOT DETECTED.  The SARS-CoV-2 RNA is generally detectable in upper and lower respiratory specimens during the acute phase of infection. Negative results do not preclude SARS-CoV-2 infection, do not rule out co-infections with other pathogens, and should not be used as the sole basis for treatment or other patient management  decisions. Negative results must be combined with clinical observations, patient history, and epidemiological information. The expected result is Negative.  Fact Sheet for Patients: HairSlick.no  Fact Sheet for Healthcare Providers: quierodirigir.com  This test is not yet approved or cleared by the [place] FDA and  has been authorized for detection and/or diagnosis of SARS-CoV-2 by FDA under an Emergency Use Authorization (EUA). This EUA will remain  in effect (meaning this test can be used) for the duration of the COVID-19 declaration under Se ction 564(b)(1) of the Act, 21 U.S.C. section 360bbb-3(b)(1), unless the authorization is terminated or revoked sooner.  Performed at [facility], [address], [place], [place] [address]      Labs:  CBC: Recent Labs  Lab 03/31/21 1249 04/01/21 1030 04/02/21 0309  WBC 11.7* 9.5 5.8  NEUTROABS 9.7*  --  4.1  HGB 13.0 11.9* 12.0  HCT 39.5 35.6* 37.0  MCV 88.8 88.8 89.6  PLT 240 216 212   BMP &GFR Recent Labs  Lab 03/31/21 1249 04/01/21 0327 04/02/21 0309 04/03/21 0408  NA 139 139 142 142  K 3.9 4.0 3.2* 5.3*  CL 111 108 109 114*  CO2 22 26 26 27   GLUCOSE 157* 106* 70 93  BUN 17 15 13 19   CREATININE 0.76 0.62 0.65 0.71  CALCIUM 7.9* 8.8* 8.8* 8.9  MG  --   --   --  2.1  PHOS  --   --   --  2.2*   CrCl cannot be calculated (Unknown ideal  weight.). Liver & Pancreas: Recent Labs  Lab 04/03/21 0408  ALBUMIN 3.5   No results for input(s): LIPASE, AMYLASE in the last 168 hours. No results for input(s): AMMONIA in the last 168 hours. Diabetic: Recent Labs    04/01/21 1030  HGBA1C 5.7*   No results for input(s): GLUCAP in the last 168 hours. Cardiac Enzymes: No results for input(s): CKTOTAL, CKMB, CKMBINDEX, TROPONINI in the last 168 hours. No results for input(s): PROBNP in the last 8760 hours. Coagulation Profile: No results for input(s):  INR, PROTIME in the last 168 hours. Thyroid Function Tests: No results for input(s): TSH, T4TOTAL, FREET4, T3FREE, THYROIDAB in the last 72 hours. Lipid Profile: No results for input(s): CHOL, HDL, LDLCALC, TRIG, CHOLHDL, LDLDIRECT in the last 72 hours. Anemia Panel: No results for input(s): VITAMINB12, FOLATE, FERRITIN, TIBC, IRON, RETICCTPCT in the last 72 hours. Urine analysis:    Component Value Date/Time   BILIRUBINUR n 03/24/2015 0914   PROTEINUR n 03/24/2015 0914   UROBILINOGEN negative 03/24/2015 0914   NITRITE n 03/24/2015 0914   LEUKOCYTESUR Negative 03/24/2015 0914   Sepsis Labs: Invalid input(s): PROCALCITONIN, LACTICIDVEN   Time coordinating discharge: 35 minutes  SIGNED:  Almon Herculesaye T Oumou Smead, MD  [facility] 04/03/2021, 2:32 PM  If 7PM-7AM, please contact night-coverage [email or web address]

## 2021-04-03 NOTE — Progress Notes (Signed)
Walked w pt in the halls w the front wheel walker and pt tol well w/o any issues. D/C per w/c w her walker from home, w all her belongings in stable condition.

## 2021-04-07 ENCOUNTER — Other Ambulatory Visit: Payer: Self-pay

## 2021-04-07 ENCOUNTER — Ambulatory Visit: Payer: BC Managed Care – PPO | Attending: Student | Admitting: Physical Therapy

## 2021-04-07 DIAGNOSIS — R2689 Other abnormalities of gait and mobility: Secondary | ICD-10-CM | POA: Diagnosis not present

## 2021-04-07 DIAGNOSIS — R2681 Unsteadiness on feet: Secondary | ICD-10-CM | POA: Diagnosis not present

## 2021-04-07 DIAGNOSIS — R42 Dizziness and giddiness: Secondary | ICD-10-CM | POA: Insufficient documentation

## 2021-04-07 NOTE — Patient Instructions (Signed)
Gaze Stabilization: Tip Card  1.Target must remain in focus, not blurry, and appear stationary while head is in motion. 2.Perform exercises with small head movements (45 to either side of midline). 3.Increase speed of head motion so long as target is in focus. 4.If you wear eyeglasses, be sure you can see target through lens (therapist will give specific instructions for bifocal / progressive lenses). 5.These exercises may provoke dizziness or nausea. Work through these symptoms. If too dizzy, slow head movement slightly. Rest between each exercise. 6.Exercises demand concentration; avoid distractions. 7.For safety, perform standing exercises close to a counter, wall, corner, or next to someone.     Gaze Stabilization: Sitting    Keeping eyes on target on wall \\_10N  feet away, tilt head down 15-30 and move head side to side for _60___ seconds. Repeat while moving head up and down for __60__ seconds. Do _3-5___ sessions per day. Repeat using target on pattern background.

## 2021-04-08 ENCOUNTER — Encounter: Payer: Self-pay | Admitting: Physical Therapy

## 2021-04-08 NOTE — Therapy (Signed)
[facility] [address] [place], [place], [address] Phone: [phone]   Fax:  [phone]  Physical Therapy Evaluation  Patient Details  Name: Ashley Estes MRN: [id number] Date of Birth: 17-May-1956 Referring Provider (PT): Candelaria Stagers, MD (hospitalist) : Cert will be sent to PCP Guerry Bruin, MD   Encounter Date: 04/07/2021   PT End of Session - 04/08/21 1441    Visit Number 1    Number of Visits 9    Date for PT Re-Evaluation 05/06/21    Authorization Type BCBS    PT Start Time 0935    PT Stop Time 1020    PT Time Calculation (min) 45 min    Activity Tolerance Patient tolerated treatment well    Behavior During Therapy [facility] for tasks assessed/performed           Past Medical History:  Diagnosis Date  . Bursitis of shoulder   . Family history of breast cancer   . Family history of cancer   . Hypercholesterolemia   . Migraines   . Osteoporosis   . Periodontal disease     History reviewed. No pertinent surgical history.  There were no vitals filed for this visit.    Subjective Assessment - 04/07/21 0940    Subjective Pt states onset of vertigo started last Thursday (03-31-21) at 10:00 am - with table cloth moving; went to sofa and really started spinning; called EMS - hospitalized 5-12 - 04-03-21; pt lives alone; pt states she is "sloshy and muddled in my brain" - fine lying down; once I get up I have to move really slowly; has not had nausea since the first day (last Thursday); has not had a headache for a week; pt did sustain a fall in the hospital on 04-01-21 due to dizziness, in which she hit her head    Pertinent History migraines    Diagnostic tests MRI and MRA (both negative)    Patient Stated Goals resolve dizziness and be able to drive    Currently in Pain? No/denies              [facility] PT Assessment - 04/08/21 0001      Assessment   Medical Diagnosis Dizziness (BPPV vs. labyrinthitis)     Referring Provider (PT) Candelaria Stagers, MD (hospitalist) : Cert will be sent to PCP Guerry Bruin, MD    Onset Date/Surgical Date 03/31/21    Prior Therapy was seen on acute      Precautions   Precautions Fall;Other (comment)   dizziness     Restrictions   Weight Bearing Restrictions No      Balance Screen   Has the patient fallen in the past 6 months Yes    How many times? 1   on 04-01-21 - in hospital   Has the patient had a decrease in activity level because of a fear of falling?  Yes    Is the patient reluctant to leave their home because of a fear of falling?  No      Prior Function   Level of Independence Independent   is not currently driving due to dizziness     Observation/Other Assessments   Focus on Therapeutic Outcomes (FOTO)  DPS 45/100 with risk adjusted 54/100:  DFS 45.4/100                  Vestibular Assessment - 04/08/21 0001      Vestibular Assessment   General Observation Pt using  RW for assistance with ambulation- amb. very slowly and looking down at floor      Symptom Behavior   Type of Dizziness  Spinning;Blurred vision;Imbalance;Unsteady with head/body turns;"Funny feeling in head"    Frequency of Dizziness pt states dizziness occurs when she is standing up or walking - feels fine in seated position    Duration of Dizziness varies    Symptom Nature Motion provoked    Aggravating Factors Activity in general    Relieving Factors Rest;Lying supine;Head stationary    Progression of Symptoms Better      Oculomotor Exam   Oculomotor Alignment Normal    Spontaneous Absent    Gaze-induced  Absent    Smooth Pursuits Intact   some mild nystagmus observed on 1st rep but quickly habituated   Saccades Intact    Comment pt reports more dizziness with ver      Vestibulo-Ocular Reflex   VOR Cancellation Normal      Visual Acuity   Static line 7    Dynamic line 3 - part of 4              Objective measurements completed on examination: See above  findings.               PT Education - 04/08/21 1440    Education Details pt instructed in x1 viewing in seated position    Person(s) Educated Patient    Methods Explanation;Demonstration;Handout    Comprehension Verbalized understanding;Returned demonstration            PT Short Term Goals - 04/08/21 1529      PT SHORT TERM GOAL #1   Title Pt will ambulate 230' without device with CGA for incr. community accessibility.    Time 3    Period Weeks    Status New    Target Date 04/29/21      PT SHORT TERM GOAL #2   Title Improve DVA to </= 3 line difference for improved gaze stabilization.    Time 3    Period Weeks    Status New    Target Date 04/29/21      PT SHORT TERM GOAL #3   Title Pt will subjectively report at least 25% improvement in vertigo.    Time 3    Period Weeks    Status New    Target Date 04/29/21      PT SHORT TERM GOAL #4   Title Independent in HEP for vestibular exercises.    Time 3    Period Weeks    Status New    Target Date 04/29/21             PT Long Term Goals - 04/08/21 1526      PT LONG TERM GOAL #1   Title Pt will increase FOTO DPS from 45/100 to >/= 60/100 to demo improvement in dizziness.    Baseline 45/100    Time 6    Period Weeks    Status New    Target Date 05/20/21      PT LONG TERM GOAL #2   Title Improve DVA from 4 line difference to </= 2 line difference to improve gaze stabilization for return to driving.    Baseline line 7/line 3    Time 6    Period Weeks    Status New    Target Date 05/20/21      PT LONG TERM GOAL #3   Title Improve FGA score by at least  8 points for increased balance and safety with gait.    Time 6    Period Weeks    Status New    Target Date 05/20/21      PT LONG TERM GOAL #4   Title Pt will be independent with ambulation (1000'+) without use of device.    Time 6    Period Weeks    Status New    Target Date 05/20/21      PT LONG TERM GOAL #5   Title Independent in updated  HEP as appropriate for vestibular exercises.    Time 6    Period Weeks    Status New    Target Date 05/20/21                  Plan - 04/08/21 1443    Clinical Impression Statement Pt is a 65 yr old lady who presents with vertigo with sudden onset on 03-31-21; pt was transferred by EMS to [facility] ED on 03-31-21 and discharged home on 04-03-21 with use of RW for assistance with ambulation.  Chart notes state "BPPV vs. Labyrinthitis", however, pt reports no hearing loss.  She has h/o migraines but reports no HA at this time.  Pt reports moderate dizziness with head movements, with standing and walking.  Nystagmus was noted on initial rep of horizontal smooth pursuit testing.  Pt has abnormal DVA with a 4 line difference, indicative of decreased VOR.  Pt's symptoms appear to be consistent with a vestibular neuritis.  Pt will benefit from PT to address dizziness, gait and balance dysfunction.    Personal Factors and Comorbidities Comorbidity 2;Transportation    Comorbidities migraines, osteoporosis, h/o bursitis of shoulder    Examination-Activity Limitations Bend;Lift;Stand;Locomotion Level;Transfers;Squat;Stairs;Reach Overhead;Dressing    Examination-Participation Restrictions Cleaning;Community Activity;Driving;Laundry;Shop;Meal Prep    Stability/Clinical Decision Making Evolving/Moderate complexity    Clinical Decision Making Moderate    Rehab Potential Good    PT Frequency 2x / week    PT Duration 6 weeks    PT Treatment/Interventions ADLs/Self Care Home Management;Balance training;Neuromuscular re-education;Therapeutic exercise;Therapeutic activities;Stair training;Gait training;Patient/family education;Vestibular    PT Next Visit Plan check x1 viewing exercise - do MCTSIB test and add to HEP as appropriate    PT Home Exercise Plan x1 viewing (04-07-21)    Consulted and Agree with Plan of Care Patient           Patient will benefit from skilled therapeutic intervention in order  to improve the following deficits and impairments:  Dizziness,Decreased balance,Difficulty walking  Visit Diagnosis: Dizziness and giddiness - Plan: PT plan of care cert/re-cert  Other abnormalities of gait and mobility - Plan: PT plan of care cert/re-cert  Unsteadiness on feet - Plan: PT plan of care cert/re-cert     Problem List Patient Active Problem List   Diagnosis Date Noted  . Intractable nausea and vomiting 03/31/2021  . Hypercholesterolemia   . Loud snoring 07/06/2020  . 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 Other fatigue 07/06/2020  . OSA (obstructive sleep apnea) 07/06/2020  . Family history of breast cancer   . Family history of cancer     Kary Kos, PT 04/08/2021, 3:54 PM  [facility] [address] [place], [place], [address] Phone: [phone]   Fax:  [phone]  Name: MISTIE ADNEY MRN: [id number] Date of Birth: [birth year]

## 2021-04-14 ENCOUNTER — Ambulatory Visit: Payer: BC Managed Care – PPO | Admitting: Physical Therapy

## 2021-04-14 ENCOUNTER — Other Ambulatory Visit: Payer: Self-pay

## 2021-04-14 DIAGNOSIS — R2689 Other abnormalities of gait and mobility: Secondary | ICD-10-CM

## 2021-04-14 DIAGNOSIS — R42 Dizziness and giddiness: Secondary | ICD-10-CM | POA: Diagnosis not present

## 2021-04-14 DIAGNOSIS — R2681 Unsteadiness on feet: Secondary | ICD-10-CM | POA: Diagnosis not present

## 2021-04-14 NOTE — Patient Instructions (Signed)
Gaze Stabilization: Standing Feet Together    Feet together, keeping eyes on target on wall __3__ feet away, tilt head down 15-30 and move head side to side for __60__ seconds. Repeat while moving head up and down for __60__ seconds. Do _2-3___ sessions per day. Repeat using target on pattern background after a few days.     Random Direction Head Motion    Perform without Dan Humphreys but in your hallway where you have a wall to each side for safety. Walking on solid surface, move head and eyes in different directions every __3__ steps.  Up, down, left, right. Repeat sequence __4__ times per session. Do __2__ sessions per day. As you feel comfortable, add in diagonal head turns.

## 2021-04-14 NOTE — Therapy (Signed)
[facility] [address] [place], [place], [address] Phone: [phone]   Fax:  [phone]  Physical Therapy Treatment  Patient Details  Name: Ashley Estes MRN: [id number] Date of Birth: 07-27-1956 Referring Provider (PT): Candelaria Stagers, MD (hospitalist) : Cert will be sent to PCP Guerry Bruin, MD   Encounter Date: 04/14/2021   PT End of Session - 04/14/21 1056    Visit Number 2    Number of Visits 9    Date for PT Re-Evaluation 05/06/21    Authorization Type BCBS    PT Start Time 0845    PT Stop Time 0935    PT Time Calculation (min) 50 min    Activity Tolerance Patient tolerated treatment well    Behavior During Therapy [facility] for tasks assessed/performed           Past Medical History:  Diagnosis Date  . Bursitis of shoulder   . Family history of breast cancer   . Family history of cancer   . Hypercholesterolemia   . Migraines   . Osteoporosis   . Periodontal disease     No past surgical history on file.  There were no vitals filed for this visit.   Subjective Assessment - 04/14/21 0852    Subjective Feels marginally better.  Has been walking in her house without RW but can't walk outside without it yet.  Has been performing the exercises and has tried busy background and lateral tilts with the letter.  Having minimal symptoms but no nausea.  No falls.  No HA this week.  Asking about wearing bifocals with exercises.    Pertinent History migraines    Diagnostic tests MRI and MRA (both negative)    Patient Stated Goals resolve dizziness and be able to drive    Currently in Pain? No/denies                   Vestibular Assessment - 04/14/21 0854      Positional Testing   Dix-Hallpike --    Sidelying Test Sidelying Right;Sidelying Left    Horizontal Canal Testing Horizontal Canal Right;Horizontal Canal Left      Sidelying Right   Sidelying Right Duration 0    Sidelying Right Symptoms No  nystagmus      Sidelying Left   Sidelying Left Duration 0    Sidelying Left Symptoms No nystagmus      Horizontal Canal Right   Horizontal Canal Right Duration 0    Horizontal Canal Right Symptoms Normal      Horizontal Canal Left   Horizontal Canal Left Duration 0    Horizontal Canal Left Symptoms Normal      Balancemaster   Balancemaster Comment MCTSIB: 30 seconds all 4 conditions; moderate sway with final condition but no LOB      Positional Sensitivities   Sit to Supine No dizziness    Supine to Left Side No dizziness    Supine to Right Side No dizziness    Supine to Sitting Mild dizziness    Nose to Right Knee No dizziness    Right Knee to Sitting Mild dizziness    Nose to Left Knee No dizziness    Left Knee to Sitting Mild dizziness    Head Turning x 5 Lightheadedness    Head Nodding x 5 Lightheadedness    Pivot Right in Standing Mild dizziness    Pivot Left in Standing Mild dizziness    Rolling Right No dizziness  Rolling Left No dizziness    Positional Sensitivities Comments no nystagmus and no nausea noted                     Vestibular Treatment/Exercise - 04/14/21 0910      Vestibular Treatment/Exercise   Vestibular Treatment Provided Gaze    Gaze Exercises X1 Viewing Horizontal;X1 Viewing Vertical      X1 Viewing Horizontal   Foot Position standing feet apart, feet together    Reps 2    Comments 60 seconds, mild symptoms with feet together      X1 Viewing Vertical   Foot Position standing feet apart, feet together    Reps 2    Comments 60 seconds, mild symptoms with feet together              Balance Exercises - 04/14/21 1121      Balance Exercises: Standing   Gait with Head Turns Forward;4 reps;Limitations    Gait with Head Turns Limitations head L/R, up/down with close supervision; attempted diagonal but pt reported nausea and unsteadiness             PT Education - 04/14/21 1120    Education Details clinical findings  today, updates to HEP, assisted with filling out transportation application    Person(s) Educated Patient    Methods Explanation;Handout;Demonstration    Comprehension Verbalized understanding;Returned demonstration            PT Short Term Goals - 04/08/21 1529      PT SHORT TERM GOAL #1   Title Pt will ambulate 230' without device with CGA for incr. community accessibility.    Time 3    Period Weeks    Status New    Target Date 04/29/21      PT SHORT TERM GOAL #2   Title Improve DVA to </= 3 line difference for improved gaze stabilization.    Time 3    Period Weeks    Status New    Target Date 04/29/21      PT SHORT TERM GOAL #3   Title Pt will subjectively report at least 25% improvement in vertigo.    Time 3    Period Weeks    Status New    Target Date 04/29/21      PT SHORT TERM GOAL #4   Title Independent in HEP for vestibular exercises.    Time 3    Period Weeks    Status New    Target Date 04/29/21             PT Long Term Goals - 04/08/21 1526      PT LONG TERM GOAL #1   Title Pt will increase FOTO DPS from 45/100 to >/= 60/100 to demo improvement in dizziness.    Baseline 45/100    Time 6    Period Weeks    Status New    Target Date 05/20/21      PT LONG TERM GOAL #2   Title Improve DVA from 4 line difference to </= 2 line difference to improve gaze stabilization for return to driving.    Baseline line 7/line 3    Time 6    Period Weeks    Status New    Target Date 05/20/21      PT LONG TERM GOAL #3   Title Improve FGA score by at least 8 points for increased balance and safety with gait.    Time 6  Period Weeks    Status New    Target Date 05/20/21      PT LONG TERM GOAL #4   Title Pt will be independent with ambulation (1000'+) without use of device.    Time 6    Period Weeks    Status New    Target Date 05/20/21      PT LONG TERM GOAL #5   Title Independent in updated HEP as appropriate for vestibular exercises.    Time 6     Period Weeks    Status New    Target Date 05/20/21                 Plan - 04/14/21 1101    Clinical Impression Statement Pt is already demonstrating significant progress in one week.  Performed positional vertigo testing with pt negative for horizontal and posterior positional vertigo.  Performed motion sensitivity testing with pt demonstrating greatest sensitivity to vertical head movements/displacement and turns.  Also performed assessment of sensory integration with pt demonstrating ability to maintain balance on all 4 conditions with only mild-moderate sway.  Due to progress, able to progress x1 viewing to standing and add walking with head turns to HEP.  Also due to progress pt prefers to come for one session only this week.    Personal Factors and Comorbidities Comorbidity 2;Transportation    Comorbidities migraines, osteoporosis, h/o bursitis of shoulder    Examination-Activity Limitations Bend;Lift;Stand;Locomotion Level;Transfers;Squat;Stairs;Reach Overhead;Dressing    Examination-Participation Restrictions Cleaning;Community Activity;Driving;Laundry;Shop;Meal Prep    Stability/Clinical Decision Making Evolving/Moderate complexity    Rehab Potential Good    PT Frequency 2x / week    PT Duration 6 weeks    PT Treatment/Interventions ADLs/Self Care Home Management;Balance training;Neuromuscular re-education;Therapeutic exercise;Therapeutic activities;Stair training;Gait training;Patient/family education;Vestibular    PT Next Visit Plan Assess FGA and reset LTG baseline.       Progress HEP, add corner balance with vision removed/compliant surface, dynamic gait on variety of surfaces, turns, head movements to progress away from RW    PT Home Exercise Plan x1 viewing (04-07-21)    Consulted and Agree with Plan of Care Patient           Patient will benefit from skilled therapeutic intervention in order to improve the following deficits and impairments:  Dizziness,Decreased  balance,Difficulty walking  Visit Diagnosis: Dizziness and giddiness  Other abnormalities of gait and mobility  Unsteadiness on feet     Problem List Patient Active Problem List   Diagnosis Date Noted  . Intractable nausea and vomiting 03/31/2021  . Hypercholesterolemia   . Loud snoring 07/06/2020  . 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 Other fatigue 07/06/2020  . OSA (obstructive sleep apnea) 07/06/2020  . Family history of breast cancer   . Family history of cancer     Dierdre Highman, PT, DPT 04/14/21    11:24 AM    [facility] [address] [place], [place], [address] Phone: [phone]   Fax:  [phone]  Name: ANAHLI ARVANITIS MRN: [id number] Date of Birth: 1956-03-21

## 2021-04-15 ENCOUNTER — Ambulatory Visit: Payer: BC Managed Care – PPO | Admitting: Physical Therapy

## 2021-04-19 ENCOUNTER — Ambulatory Visit: Payer: BC Managed Care – PPO | Admitting: Physical Therapy

## 2021-04-19 ENCOUNTER — Encounter: Payer: Self-pay | Admitting: Physical Therapy

## 2021-04-19 ENCOUNTER — Other Ambulatory Visit: Payer: Self-pay

## 2021-04-19 DIAGNOSIS — R42 Dizziness and giddiness: Secondary | ICD-10-CM | POA: Diagnosis not present

## 2021-04-19 DIAGNOSIS — R2689 Other abnormalities of gait and mobility: Secondary | ICD-10-CM | POA: Diagnosis not present

## 2021-04-19 DIAGNOSIS — R2681 Unsteadiness on feet: Secondary | ICD-10-CM | POA: Diagnosis not present

## 2021-04-19 NOTE — Therapy (Signed)
[facility] [address] [place], [place], [address] Phone: [phone]   Fax:  [phone]  Physical Therapy Treatment  Patient Details  Name: Ashley Estes MRN: [id number] Date of Birth: 07-30-1956 Referring Provider (PT): Candelaria Stagers, MD (hospitalist) : Cert will be sent to PCP Guerry Bruin, MD   Encounter Date: 04/19/2021   PT End of Session - 04/19/21 2041    Visit Number 3    Number of Visits 9    Date for PT Re-Evaluation 05/06/21    Authorization Type BCBS    PT Start Time 1535    PT Stop Time 1630    PT Time Calculation (min) 55 min    Activity Tolerance Patient tolerated treatment well    Behavior During Therapy [facility] for tasks assessed/performed           Past Medical History:  Diagnosis Date  . Bursitis of shoulder   . Family history of breast cancer   . Family history of cancer   . Hypercholesterolemia   . Migraines   . Osteoporosis   . Periodontal disease     History reviewed. No pertinent surgical history.  There were no vitals filed for this visit.   Subjective Assessment - 04/19/21 1540    Subjective Pt states she is walking in her home without the walker but still needs walker when she walks outside (pt declines to amb. outside today due to the heat)    Pertinent History migraines    Diagnostic tests MRI and MRA (both negative)    Patient Stated Goals resolve dizziness and be able to drive    Currently in Pain? No/denies              [facility] PT Assessment - 04/19/21 0001      Functional Gait  Assessment   Gait assessed  Yes    Gait Level Surface Walks 20 ft in less than 5.5 sec, no assistive devices, good speed, no evidence for imbalance, normal gait pattern, deviates no more than 6 in outside of the 12 in walkway width.   5.03   Change in Gait Speed Able to smoothly change walking speed without loss of balance or gait deviation. Deviate no more than 6 in outside of the 12 in  walkway width.    Gait with Horizontal Head Turns Performs head turns smoothly with no change in gait. Deviates no more than 6 in outside 12 in walkway width    Gait with Vertical Head Turns Performs head turns with no change in gait. Deviates no more than 6 in outside 12 in walkway width.    Gait and Pivot Turn Pivot turns safely within 3 sec and stops quickly with no loss of balance.    Step Over Obstacle Is able to step over 2 stacked shoe boxes taped together (9 in total height) without changing gait speed. No evidence of imbalance.    Gait with Narrow Base of Support Is able to ambulate for 10 steps heel to toe with no staggering.    Gait with Eyes Closed Walks 20 ft, uses assistive device, slower speed, mild gait deviations, deviates 6-10 in outside 12 in walkway width. Ambulates 20 ft in less than 9 sec but greater than 7 sec.    Ambulating Backwards Walks 20 ft, no assistive devices, good speed, no evidence for imbalance, normal gait    Steps Alternating feet, no rail.    Total Score 29  Sensory Organization Test - composite score 74/100 (N= 70/100)  Somatosensory, visual and vestibular inputs WNL's  Condition 1 - all 3 trials WNL's Condition 2 - all 3 trials WNL's Condition 3 - all 3 trials WNL's Condition 4 - trial 1 decr.; trials 2 & 3 WNL's Condition 5 - trials 1 and 3 WNL's;  Trial 2 slightly decr.  Condition 6 - trial 1 decr. ; trials 2 and 3 WNL's                Balance Exercises - 04/19/21 0001      Balance Exercises: Standing   Standing Eyes Opened Narrow base of support (BOS);Wide (BOA);Head turns;Foam/compliant surface   horizontal and vertical head turns 5 reps each   Standing Eyes Closed Narrow base of support (BOS);Wide (BOA);Head turns;Foam/compliant surface   horizontal & vertical head turns 5 reps each direction   Other Standing Exercises Pt performed amb. tossing and catching ball straight up, then on Rt side/Lt side; amb. backwards  tossing & catching ball;  amb. making circles clockwise & coutner clockwise directions; "s" pattern, "+" pattern and "X" pattern             PT Education - 04/19/21 2041    Education Details balance on foam - 4 positions - feet apart/together and EO/EC    Person(s) Educated Patient    Methods Explanation;Demonstration;Handout    Comprehension Verbalized understanding;Returned demonstration            PT Short Term Goals - 04/19/21 2047      PT SHORT TERM GOAL #1   Title Pt will ambulate 230' without device with CGA for incr. community accessibility.    Time 3    Period Weeks    Status New    Target Date 04/29/21      PT SHORT TERM GOAL #2   Title Improve DVA to </= 3 line difference for improved gaze stabilization.    Time 3    Period Weeks    Status New    Target Date 04/29/21      PT SHORT TERM GOAL #3   Title Pt will subjectively report at least 25% improvement in vertigo.    Time 3    Period Weeks    Status New    Target Date 04/29/21      PT SHORT TERM GOAL #4   Title Independent in HEP for vestibular exercises.    Time 3    Period Weeks    Status New    Target Date 04/29/21             PT Long Term Goals - 04/19/21 2047      PT LONG TERM GOAL #1   Title Pt will increase FOTO DPS from 45/100 to >/= 60/100 to demo improvement in dizziness.    Baseline 45/100    Time 6    Period Weeks    Status New      PT LONG TERM GOAL #2   Title Improve DVA from 4 line difference to </= 2 line difference to improve gaze stabilization for return to driving.    Baseline line 7/line 3    Time 6    Period Weeks    Status New      PT LONG TERM GOAL #3   Title Improve FGA score by at least 8 points for increased balance and safety with gait.    Baseline score 29/30 on 04-19-21    Time 6  Period Weeks    Status Deferred      PT LONG TERM GOAL #4   Title Pt will be independent with ambulation (1000'+) without use of device.    Time 6    Period Weeks     Status New      PT LONG TERM GOAL #5   Title Independent in updated HEP as appropriate for vestibular exercises.    Time 6    Period Weeks    Status New                 Plan - 04/19/21 2042    Clinical Impression Statement Pt is progressing much quicker than initially anticipated and reports much improvement in balance in today's session compared to that in previous session last week.  Pt is still using RW for assistance with ambulation but states she is not using this device in the home.  Pt's FGA score = 29/30; SOT score is WNL's with composite score 74/100  and somatosensory, visua and vestibular inputs WNL's.  Added balance on foam to HEP; may D/C next session if no problems reported.    Personal Factors and Comorbidities Comorbidity 2;Transportation    Comorbidities migraines, osteoporosis, h/o bursitis of shoulder    Examination-Activity Limitations Bend;Lift;Stand;Locomotion Level;Transfers;Squat;Stairs;Reach Overhead;Dressing    Examination-Participation Restrictions Cleaning;Community Activity;Driving;Laundry;Shop;Meal Prep    Stability/Clinical Decision Making Evolving/Moderate complexity    Rehab Potential Good    PT Frequency 2x / week    PT Duration 6 weeks    PT Treatment/Interventions ADLs/Self Care Home Management;Balance training;Neuromuscular re-education;Therapeutic exercise;Therapeutic activities;Stair training;Gait training;Patient/family education;Vestibular    PT Next Visit Plan Check LTG's - D/C?    Progress HEP, add corner balance with vision removed/compliant surface, dynamic gait on variety of surfaces, turns, head movements to progress away from RW    PT Home Exercise Plan x1 viewing (04-07-21)    Consulted and Agree with Plan of Care Patient           Patient will benefit from skilled therapeutic intervention in order to improve the following deficits and impairments:  Dizziness,Decreased balance,Difficulty walking  Visit Diagnosis: Dizziness and  giddiness  Unsteadiness on feet     Problem List Patient Active Problem List   Diagnosis Date Noted  . Intractable nausea and vomiting 03/31/2021  . Hypercholesterolemia   . Loud snoring 07/06/2020  . 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 Other fatigue 07/06/2020  . OSA (obstructive sleep apnea) 07/06/2020  . Family history of breast cancer   . Family history of cancer     Kary Kos, PT 04/19/2021, 8:48 PM  [facility] [address] [place], [place], [address] Phone: [phone]   Fax:  [phone]  Name: Ashley Estes MRN: [id number] Date of Birth: May 20, 1956

## 2021-04-20 DIAGNOSIS — H8113 Benign paroxysmal vertigo, bilateral: Secondary | ICD-10-CM | POA: Diagnosis not present

## 2021-04-22 ENCOUNTER — Ambulatory Visit: Payer: BC Managed Care – PPO | Admitting: Physical Therapy

## 2021-04-25 ENCOUNTER — Ambulatory Visit: Payer: BC Managed Care – PPO | Attending: Student | Admitting: Physical Therapy

## 2021-04-25 ENCOUNTER — Encounter: Payer: Self-pay | Admitting: Physical Therapy

## 2021-04-25 ENCOUNTER — Other Ambulatory Visit: Payer: Self-pay

## 2021-04-25 DIAGNOSIS — R2681 Unsteadiness on feet: Secondary | ICD-10-CM | POA: Insufficient documentation

## 2021-04-25 DIAGNOSIS — R42 Dizziness and giddiness: Secondary | ICD-10-CM | POA: Insufficient documentation

## 2021-04-25 NOTE — Therapy (Signed)
[facility] [address] [place], [place], [address] Phone: [phone]   Fax:  [phone]  Physical Therapy Treatment  Patient Details  Name: Ashley Estes MRN: [id number] Date of Birth: 06-Dec-1955 Referring Provider (PT): Wendee Beavers, MD (hospitalist) : Cert will be sent to PCP Domenick Gong, MD   Encounter Date: 04/25/2021   PT End of Session - 04/25/21 0920    Visit Number 4    Number of Visits 9    Date for PT Re-Evaluation 05/06/21    Authorization Type BCBS    PT Start Time 0850    PT Stop Time 0917   pt discharged - session ended early   PT Time Calculation (min) 27 min    Activity Tolerance Patient tolerated treatment well    Behavior During Therapy [facility] for tasks assessed/performed           Past Medical History:  Diagnosis Date  . Bursitis of shoulder   . Family history of breast cancer   . Family history of cancer   . Hypercholesterolemia   . Migraines   . Osteoporosis   . Periodontal disease     History reviewed. No pertinent surgical history.  There were no vitals filed for this visit.   Subjective Assessment - 04/25/21 0916    Subjective Pt amb. into clinic today without RW - states she is doing great and is ready for discharge; reports she drove short distance to grocery store and did grocery shopping for first time since onset of vertigo    Pertinent History migraines    Diagnostic tests MRI and MRA (both negative)    Patient Stated Goals resolve dizziness and be able to drive    Currently in Pain? No/denies              [facility] PT Assessment - 04/25/21 0001      Functional Gait  Assessment   Gait Level Surface Walks 20 ft in less than 5.5 sec, no assistive devices, good speed, no evidence for imbalance, normal gait pattern, deviates no more than 6 in outside of the 12 in walkway width.    Change in Gait Speed Able to smoothly change walking speed without loss of balance or gait  deviation. Deviate no more than 6 in outside of the 12 in walkway width.    Gait with Horizontal Head Turns Performs head turns smoothly with no change in gait. Deviates no more than 6 in outside 12 in walkway width    Gait with Vertical Head Turns Performs head turns with no change in gait. Deviates no more than 6 in outside 12 in walkway width.    Gait and Pivot Turn Pivot turns safely within 3 sec and stops quickly with no loss of balance.    Step Over Obstacle Is able to step over 2 stacked shoe boxes taped together (9 in total height) without changing gait speed. No evidence of imbalance.    Gait with Narrow Base of Support Is able to ambulate for 10 steps heel to toe with no staggering.    Gait with Eyes Closed Walks 20 ft, no assistive devices, good speed, no evidence of imbalance, normal gait pattern, deviates no more than 6 in outside 12 in walkway width. Ambulates 20 ft in less than 7 sec.    Ambulating Backwards Walks 20 ft, no assistive devices, good speed, no evidence for imbalance, normal gait    Steps Alternating feet, no rail.  Total Score 30               Vestibular Assessment - 04/25/21 0919      Visual Acuity   Static line 8    Dynamic line 6                     Vestibular Treatment/Exercise - 04/25/21 0001      Vestibular Treatment/Exercise   Vestibular Treatment Provided Gaze    Habituation Exercises Comment   pt performed sit to Rt sidelying to sit and then sitting to Lt sidelying at normal speed - no c/o increased dizziness with either movement                PT Education - 04/25/21 0920    Education Details reviewed progress and LTG's - reviewed current HEP and progressions - pt verbalized understanding    Person(s) Educated Patient    Methods Explanation    Comprehension Verbalized understanding            PT Short Term Goals - 04/25/21 0921      PT SHORT TERM GOAL #1   Title Pt will ambulate 230' without device with CGA for  incr. community accessibility.    Time 3    Period Weeks    Status Achieved    Target Date 04/29/21      PT SHORT TERM GOAL #2   Title Improve DVA to </= 3 line difference for improved gaze stabilization.    Time 3    Period Weeks    Status Achieved    Target Date 04/29/21      PT SHORT TERM GOAL #3   Title Pt will subjectively report at least 25% improvement in vertigo.    Time 3    Period Weeks    Status Achieved    Target Date 04/29/21      PT SHORT TERM GOAL #4   Title Independent in HEP for vestibular exercises.    Time 3    Period Weeks    Status Achieved    Target Date 04/29/21             PT Long Term Goals - 04/25/21 0849      PT LONG TERM GOAL #1   Title Pt will increase FOTO DPS from 45/100 to >/= 60/100 to demo improvement in dizziness.    Baseline 45/100; score DPS 64/100 on 04-25-21:  DFS score 61.3 on 04-25-21    Time 6    Period Weeks    Status Achieved      PT LONG TERM GOAL #2   Title Improve DVA from 4 line difference to </= 2 line difference to improve gaze stabilization for return to driving.    Baseline line 7/line 3;   line 8/ line 6 on 04-25-21    Time 6    Period Weeks    Status Achieved      PT LONG TERM GOAL #3   Title Improve FGA score by at least 8 points for increased balance and safety with gait.    Baseline score 29/30 on 04-19-21;  score 30/30 on 04-25-21    Time 6    Period Weeks    Status Deferred      PT LONG TERM GOAL #4   Title Pt will be independent with ambulation (1000'+) without use of device.    Baseline met 04-25-21    Time 6    Period Weeks  Status Achieved      PT LONG TERM GOAL #5   Title Independent in updated HEP as appropriate for vestibular exercises.    Baseline met 04-25-21    Time 6    Period Weeks    Status Achieved                 Plan - 04/25/21 0979    Clinical Impression Statement Pt has made excellent progress and is now amb. without use of RW independently with very minimal to no c/o  dizziness.  Pt has met 5/5 LTG's; DVA is now WNL's with a 2 line difference and FGA score = 30/30.  Pt is discharged due to all goals achieved and no further needs indentified at this time.  Pt states she is ready for discharge - states she is at least 90% back to baseline normal.    Personal Factors and Comorbidities Comorbidity 2;Transportation    Comorbidities migraines, osteoporosis, h/o bursitis of shoulder    Examination-Activity Limitations Bend;Lift;Stand;Locomotion Level;Transfers;Squat;Stairs;Reach Overhead;Dressing    Examination-Participation Restrictions Cleaning;Community Activity;Driving;Laundry;Shop;Meal Prep    Stability/Clinical Decision Making Evolving/Moderate complexity    Rehab Potential Good    PT Frequency 2x / week    PT Duration 6 weeks    PT Treatment/Interventions ADLs/Self Care Home Management;Balance training;Neuromuscular re-education;Therapeutic exercise;Therapeutic activities;Stair training;Gait training;Patient/family education;Vestibular    PT Next Visit Plan N/A - D/C    PT Home Exercise Plan x1 viewing (04-07-21)    Consulted and Agree with Plan of Care Patient           Patient will benefit from skilled therapeutic intervention in order to improve the following deficits and impairments:  Dizziness,Decreased balance,Difficulty walking  Visit Diagnosis: Unsteadiness on feet  Dizziness and giddiness     Problem List Patient Active Problem List   Diagnosis Date Noted  . Intractable nausea and vomiting 03/31/2021  . Hypercholesterolemia   . Loud snoring 07/06/2020  . 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 Other fatigue 07/06/2020  . OSA (obstructive sleep apnea) 07/06/2020  . Family history of breast cancer   . Family history of cancer       PHYSICAL THERAPY DISCHARGE SUMMARY  Visits from Start of Care: 4  Current functional level related to goals / functional outcomes: See above for progress towards goals - all  goals met   Remaining deficits: Pt reports minimal dizziness intermittently with supine to sit but states it is much improved.   Education / Equipment: Pt has been instructed in HEP for vestibular/balance exercises and also for gaze stabilization.  Plan: Patient agrees to discharge.  Patient goals were met. Patient is being discharged due to meeting the stated rehab goals.  ?????        Alda Lea, PT  04/25/2021, 8:40 PM  [facility] [address] [place], [place], [address] Phone: [phone]   Fax:  [phone]  Name: Ashley Estes MRN: [id number] Date of Birth: 10-Sep-1956

## 2021-05-03 ENCOUNTER — Encounter: Payer: BC Managed Care – PPO | Admitting: Physical Therapy

## 2021-05-06 ENCOUNTER — Encounter: Payer: BC Managed Care – PPO | Admitting: Physical Therapy

## 2021-05-31 NOTE — Progress Notes (Signed)
65 y.o. G1P1001 Single Asian female here for annual exam.    Vestibular neuritis dx in May.  Imaging OK.  Did physical therapy.  Dx was possible migraine HA.  She is now taking Qulipta. She has a long hx of migraines and was using Maxalt.  No vaginal bleeding.  No hot flashes.   Had genetic counseling.  No testing done.   Received 2 Covid boosters.   PCP: Guerry Bruin, MD  Patient's last menstrual period was 11/20/2006 (approximate).           Sexually active: No.  The current method of family planning is post menopausal status.    Exercising: No.  The patient does not participate in regular exercise at present. Smoker:  no  Health Maintenance: Pap:  02-01-18 Neg:Neg HR HPV, 2014 wnl:neg HR HPV per patient History of abnormal Pap:  no MMG: 04-20-20 Neg/Birads1--knows to schedule Colonoscopy  Never.  Does stool samples every year with PCP BMD:   2021 Result:  with Dr.Tisovec--took off Fosamax after being on for 7 - 8 years TDaP:  PCP Gardasil:   no FEO:FHQRFXJ blood Hep C:donates blood Screening Labs:  PCP Shingrix:  completed.    reports that she has never smoked. She has never used smokeless tobacco. She reports that she does not drink alcohol and does not use drugs.  Past Medical History:  Diagnosis Date   Bursitis of shoulder    Family history of breast cancer    Family history of cancer    Hypercholesterolemia    Migraines    Osteoporosis    Periodontal disease    Vestibular neuritis 03/31/2021    No past surgical history on file.  Current Outpatient Medications  Medication Sig Dispense Refill   calcium carbonate (OS-CAL) 600 MG TABS tablet Take 600 mg by mouth 2 (two) times daily with a meal.     Multiple Vitamins-Minerals (MULTIVITAMIN PO) Take 1 tablet by mouth daily.     OVER THE COUNTER MEDICATION Take 1 tablet by mouth daily as needed (constipation). [facility] laxative tablet - Patient don't know the name     pravastatin (PRAVACHOL) 20 MG tablet Take  20 mg by mouth daily.     QULIPTA 30 MG TABS Take 1 tablet by mouth daily.     rizatriptan (MAXALT) 10 MG tablet Take 10 mg by mouth daily as needed for migraine.     No current facility-administered medications for this visit.    Family History  Problem Relation Age of Onset   Cancer Mother        Dec from lung Ca age 76   Breast cancer Mother 25   Thyroid disease Mother    Ovarian cancer Mother        carcinoma in situ found on TAH-BAO at 75   Diabetes Father        d. from complications of diabetes at 5   Cancer Father        lung cancer - metastatic   Migraines Sister    Cancer Sister        skin cancer around the anus   Breast cancer Maternal Aunt        d. 57   Dementia Paternal Aunt    Other Paternal Uncle        both uncles died in their 29s   Dementia Maternal Grandmother    Other Paternal Grandmother        d. in childbirth   Other Paternal Grandfather  d. of ear infection   Breast cancer Other        MGMs sister    Review of Systems  All other systems reviewed and are negative.  Exam:   BP 118/72   Pulse 72   Ht 5' 3.5" (1.613 m)   Wt 121 lb (54.9 kg)   LMP 11/20/2006 (Approximate)   SpO2 98%   BMI 21.10 kg/m     General appearance: alert, cooperative and appears stated age Head: normocephalic, without obvious abnormality, atraumatic Neck: no adenopathy, supple, symmetrical, trachea midline and thyroid normal to inspection and palpation Lungs: clear to auscultation bilaterally Breasts: normal appearance, no masses or tenderness, No nipple retraction or dimpling, No nipple discharge or bleeding, No axillary adenopathy Heart: regular rate and rhythm Abdomen: soft, non-tender; no masses, no organomegaly Extremities: extremities normal, atraumatic, no cyanosis or edema Skin: skin color, texture, turgor normal. No rashes or lesions Lymph nodes: cervical, supraclavicular, and axillary nodes normal. Neurologic: grossly normal  Pelvic: External  genitalia:  no lesions              No abnormal inguinal nodes palpated.              Urethra:  normal appearing urethra with no masses, tenderness or lesions              Bartholins and Skenes: normal                 Vagina: normal appearing vagina with normal color and discharge, no lesions              Cervix: no lesions              Pap taken: no Bimanual Exam:  Uterus:  normal size, contour, position, consistency, mobility, non-tender              Adnexa: no mass, fullness, tenderness              Rectal exam: yes.  Confirms.              Anus:  normal sphincter tone, no lesions  Chaperone was present for exam:  Marchelle Folks, CMA  Assessment:   Well woman visit with normal exam. FH breast cancer.  Premenopausal. FH ovarian cancer in mother at time of her hysterectomy. Osteoporosis.  Off Fosamax. Migraine HA.  Plan: Mammogram screening discussed. Self breast awareness reviewed. Cervical cancer screening in 2024.  Guidelines for Calcium, Vitamin D, regular exercise program including cardiovascular and weight bearing exercise. We discussed genetic testing, and patient currently declines.  She will let me know if she would like to do this.  Follow up annually and prn.    After visit summary provided.

## 2021-06-06 ENCOUNTER — Encounter: Payer: Self-pay | Admitting: Obstetrics and Gynecology

## 2021-06-06 ENCOUNTER — Ambulatory Visit (INDEPENDENT_AMBULATORY_CARE_PROVIDER_SITE_OTHER): Payer: BC Managed Care – PPO | Admitting: Obstetrics and Gynecology

## 2021-06-06 ENCOUNTER — Other Ambulatory Visit: Payer: Self-pay

## 2021-06-06 VITALS — BP 118/72 | HR 72 | Ht 63.5 in | Wt 121.0 lb

## 2021-06-06 DIAGNOSIS — Z01419 Encounter for gynecological examination (general) (routine) without abnormal findings: Secondary | ICD-10-CM | POA: Diagnosis not present

## 2021-06-06 NOTE — Patient Instructions (Signed)

## 2021-06-09 ENCOUNTER — Other Ambulatory Visit: Payer: Self-pay | Admitting: Internal Medicine

## 2021-06-09 DIAGNOSIS — Z1231 Encounter for screening mammogram for malignant neoplasm of breast: Secondary | ICD-10-CM

## 2021-08-02 ENCOUNTER — Ambulatory Visit
Admission: RE | Admit: 2021-08-02 | Discharge: 2021-08-02 | Disposition: A | Discharge status: Home / Self Care | Payer: BC Managed Care – PPO | Source: Ambulatory Visit

## 2021-08-02 ENCOUNTER — Other Ambulatory Visit: Payer: Self-pay

## 2021-08-02 DIAGNOSIS — Z1231 Encounter for screening mammogram for malignant neoplasm of breast: Secondary | ICD-10-CM

## 2021-08-02 IMAGING — MG MM DIGITAL SCREENING BILAT W/ TOMO AND CAD
6 of 10 series · 6 of 30 positions shown · non-contrast
Comparison: Previous exam(s).

CLINICAL DATA: Screening.

EXAM:
DIGITAL SCREENING BILATERAL MAMMOGRAM WITH TOMOSYNTHESIS AND CAD
TECHNIQUE: Bilateral screening digital craniocaudal and mediolateral oblique
mammograms were obtained. Bilateral screening digital breast
tomosynthesis was performed. The images were evaluated with
computer-aided detection.

[L MLO synth-2D (1 of 2)]
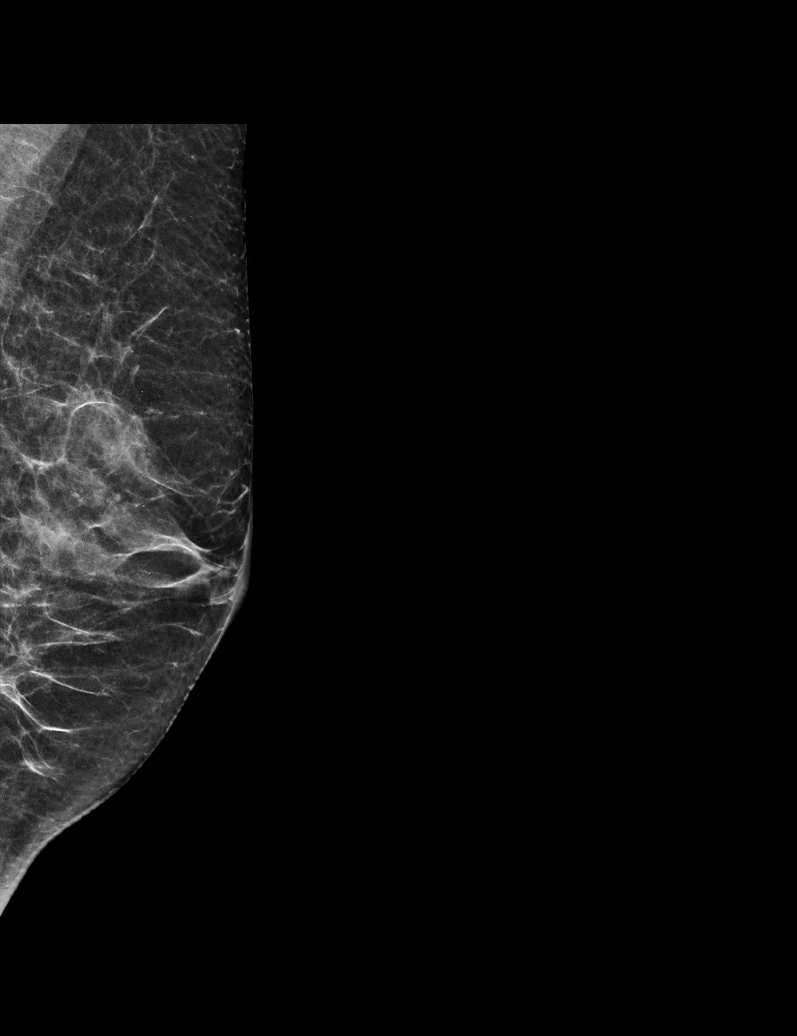

[R CC synth-2D]
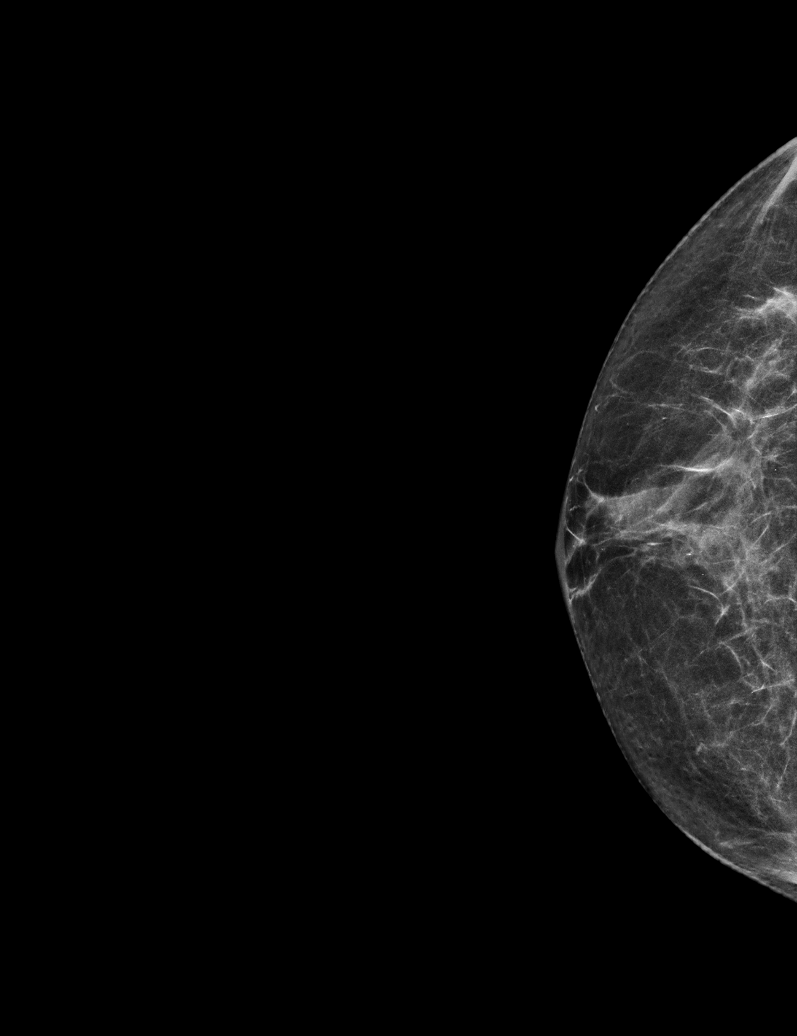

[R MLO synth-2D]
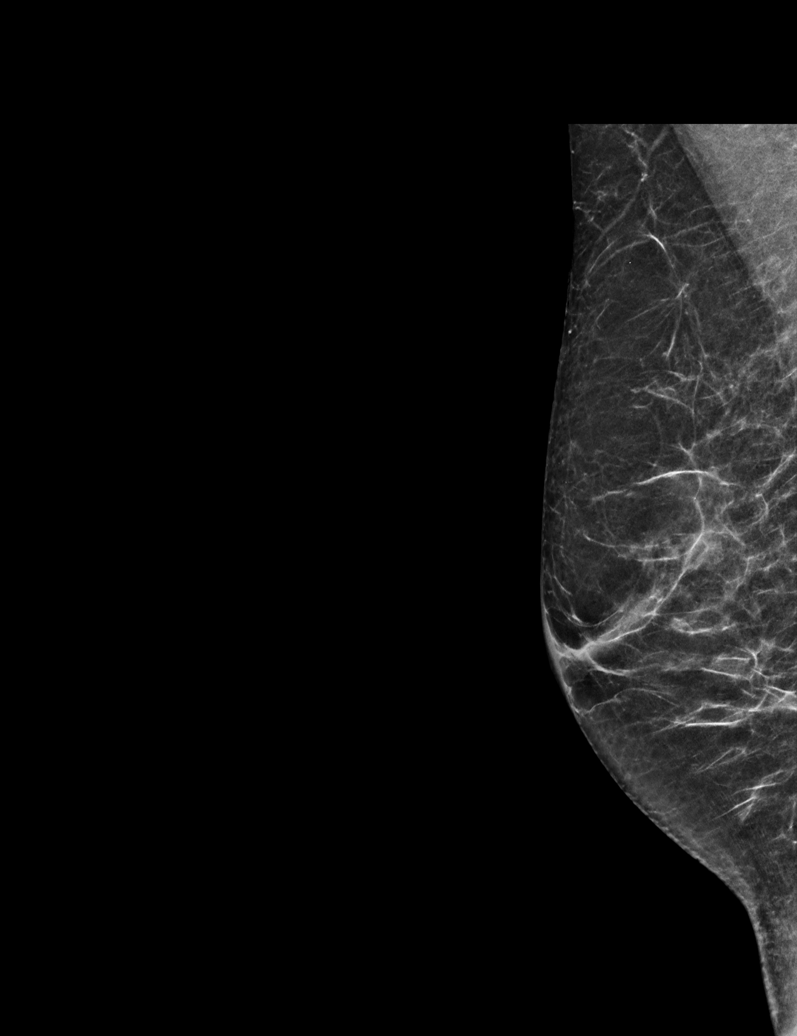

[L MLO synth-2D (2 of 2)]
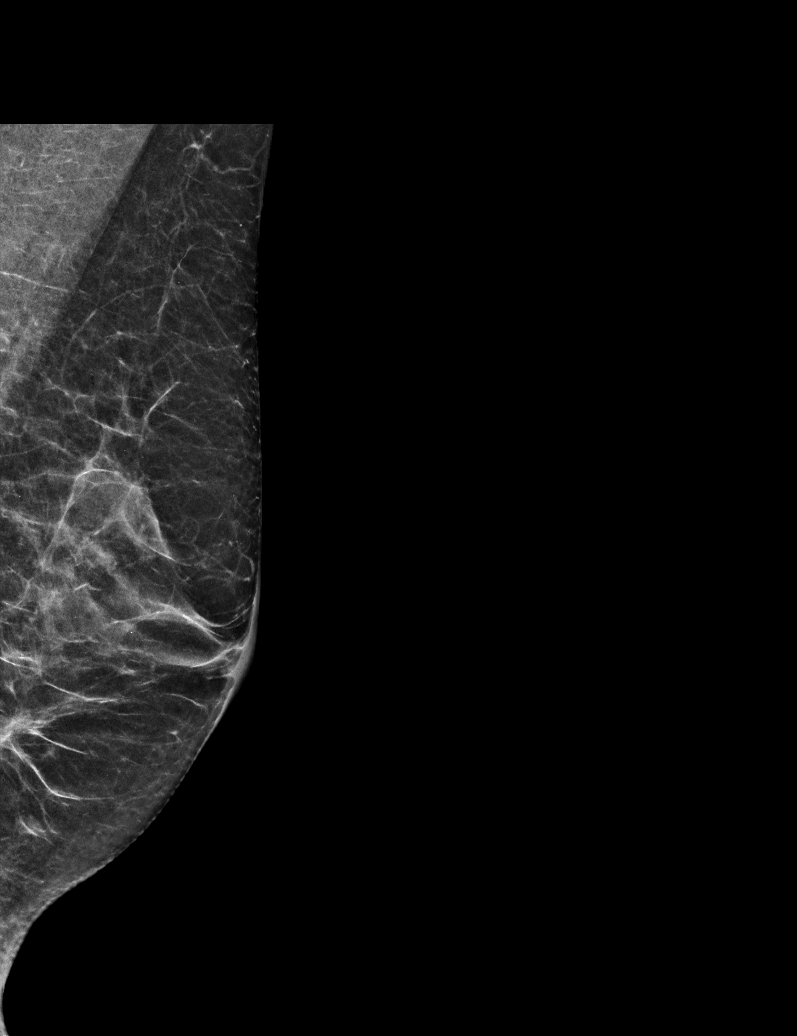

[L CC synth-2D]
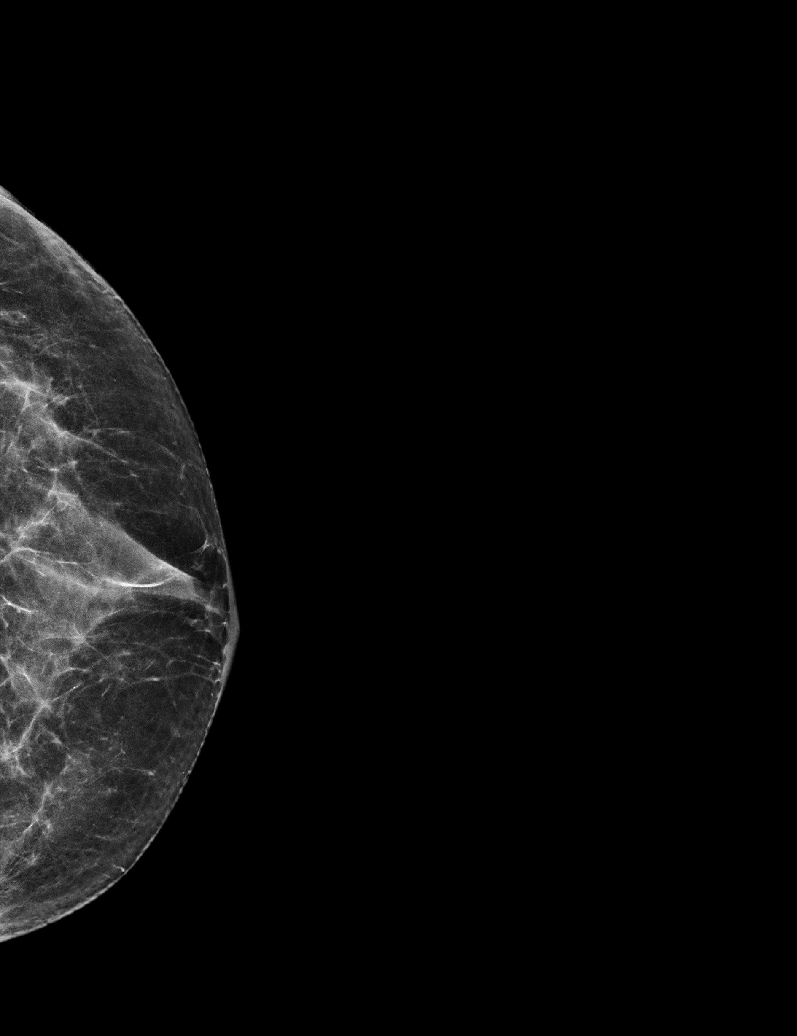

[L MLO tomo · tomo slice 25/49.0]
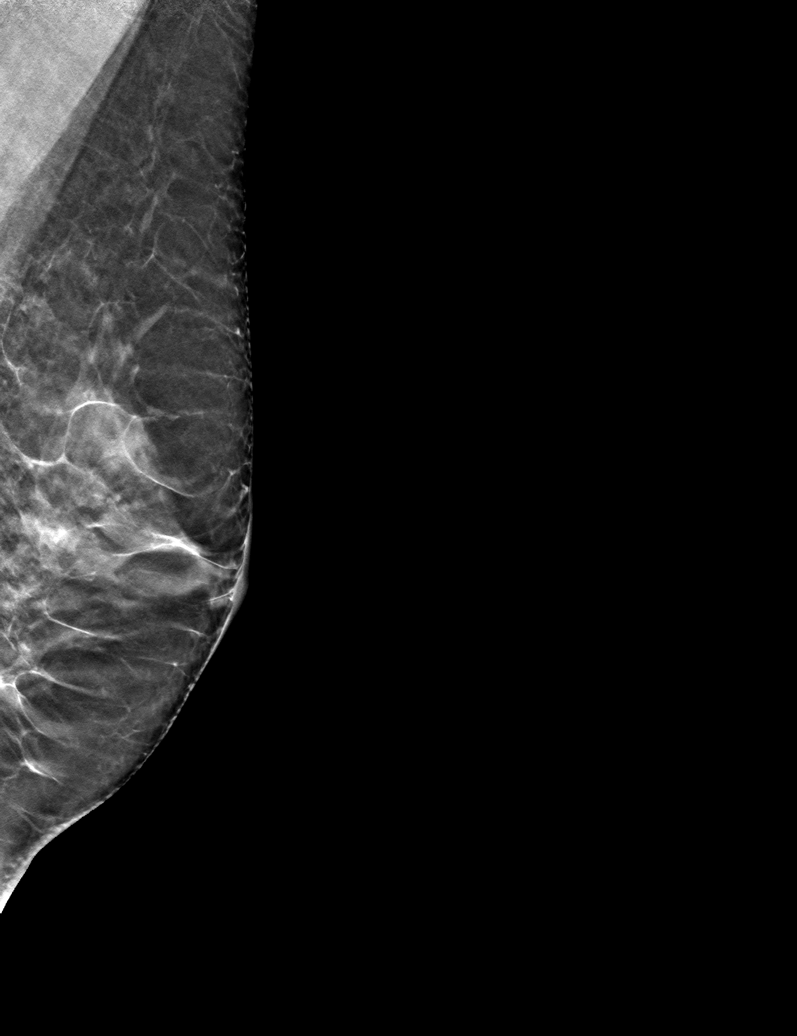

[6 of 30 positions shown; findings below may reference images not displayed]

ACR Breast Density Category c: The breast tissue is heterogeneously
dense, which may obscure small masses.
FINDINGS: There are no findings suspicious for malignancy.
IMPRESSION: No mammographic evidence of malignancy. A result letter of this
screening mammogram will be mailed directly to the patient.

RECOMMENDATION:
Screening mammogram in one year. (Code:[V2])

BI-RADS CATEGORY  1: Negative.

## 2022-02-01 ENCOUNTER — Ambulatory Visit (HOSPITAL_COMMUNITY)
Admission: EM | Admit: 2022-02-01 | Discharge: 2022-02-01 | Disposition: A | Discharge status: Home / Self Care | Payer: Medicare Other | Attending: Registered Nurse | Admitting: Registered Nurse

## 2022-02-01 ENCOUNTER — Encounter (HOSPITAL_COMMUNITY): Payer: Self-pay | Admitting: Registered Nurse

## 2022-02-01 ENCOUNTER — Telehealth (HOSPITAL_COMMUNITY): Payer: Self-pay | Admitting: Psychiatry

## 2022-02-01 DIAGNOSIS — R45851 Suicidal ideations: Secondary | ICD-10-CM | POA: Diagnosis not present

## 2022-02-01 DIAGNOSIS — F331 Major depressive disorder, recurrent, moderate: Secondary | ICD-10-CM | POA: Diagnosis not present

## 2022-02-01 NOTE — ED Provider Notes (Signed)
Behavioral Health Urgent Care Medical Screening Exam ? ?Patient Name: Ashley Estes ?MRN: [id number] ?Date of Evaluation: 02/01/22 ?Chief Complaint:   ?Diagnosis:  ?Final diagnoses:  ?MDD (major depressive disorder), recurrent episode, moderate (HCC)  ?Passive suicidal ideations  ? ? ?History of Present illness: Ashley Estes is a 66 y.o. female patient presented to [facility] as a walk in voluntarily with complaints of worsening depression and suicidal ideation.  States encouraged to come for evaluation by her daughter.  ? ?Ashley Estes, 11 y.o., female patient seen face to face by this provider, consulted with Dr. Earlene Plater; and chart reviewed on 02/01/22.  On evaluation Ashley Estes reports she has periods when she feels depressed and useless.  ?I don't feel that way all the time.  It usually happens a couple times a year and I get sucked in.  I'm just afraid that one time I'll get sucked in and won't reach out because when I feel like that I don't want to ask for help.?  Patient states she was encouraged to seek help by her daughter.  ?She is a big advocate for mental health, and she believes everyone should get help.  I'm just the opposite.  I just push it to the back and ignore it.?  Patient denies suicidal/self-harm/homicidal ideation, psychosis, and paranoia at this time.  States when she does have the suicidal thoughts they are more like ?Why am I living, what is the point of living.  I have thought of ways that I could kill myself like jumping off parking deck, but I don't want to die.?  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  Discussed intensive outpatient psychiatric services and patient states she is interested.    ?During evaluation Ashley Estes is sitting in chair in no acute distress.  She is alert/oriented x 4;  calm/cooperative; and mood congruent with affect.  She is speaking in a clear tone at moderate volume, and normal pace; with good eye contact.  Her thought process is coherent and relevant; There is no indication that she is currently responding to internal/external stimuli or experiencing delusional thought content; and she has denied suicidal/self-harm/homicidal ideation, psychosis, and paranoia.   ?Patient has remained calm throughout assessment and has answered questions appropriately.   ? ?Psychiatric Specialty Exam ? ?Presentation  ?General Appearance:Appropriate for Environment; Casual ? ?Eye Contact:Good ? ?Speech:Clear and Coherent; Normal Rate ? ?Speech Volume:Normal ? ?Handedness:Right ? ? ?Mood and Affect  ?Mood:Euthymic ? ?Affect:Appropriate; Congruent ? ? ?Thought Process  ?Thought Processes:Coherent; Goal Directed ? ?Descriptions of Associations:Intact ? ?Orientation:Full (Time, Place and Person) ? ?Thought Content:Logical ?   Hallucinations:None ? ?Ideas of Reference:None ? ?Suicidal Thoughts:No ? ?Homicidal Thoughts:No ? ? ?Sensorium  ?Memory:Immediate Good; Recent Good; Remote Good ? ?Judgment:Intact ? ?Insight:Present ? ? ?Executive Functions  ?Concentration:Good ? ?Attention Span:Good ? ?Recall:Good ? ?Fund of Knowledge:Good ? ?Language:Good ? ? ?Psychomotor Activity  ?Psychomotor Activity:Normal ? ? ?Assets  ?Assets:Communication Skills; Desire for Improvement; Financial Resources/Insurance; Housing; Physical Health; Resilience; Social Support ? ? ?Sleep  ?Sleep:Good ? ?Number of hours: No data recorded ? ?Nutritional Assessment (For OBS and FBC admissions only) ?Has the patient had a weight loss or gain of 10 pounds or more in the last 3 months?: No ?Has the patient had a decrease in food intake/or appetite?: No ?  Does the patient have dental problems?: No ?Does the patient have eating habits or behaviors that may be indicators of an eating disorder including binging or inducing vomiting?:  No ?Has the patient recently lost weight without trying?: 0 ?Has the patient been eating poorly because of a decreased appetite?: 0 ?Malnutrition Screening Tool Score: 0 ? ? ? ?Physical Exam: ?Physical Exam ?Vitals and nursing note reviewed. Exam conducted with a chaperone present.  ?Constitutional:   ?   General: She is not in acute distress. ?   Appearance: Normal appearance. She is not ill-appearing.  ?Cardiovascular:  ?   Rate and Rhythm: Normal rate.  ?Pulmonary:  ?   Effort: Pulmonary effort is normal.  ?Musculoskeletal:     ?   General: Normal range of motion.  ?   Cervical back: Normal range of motion.  ?Skin: ?   General: Skin is warm and dry.  ?Neurological:  ?   Mental Status: She is alert and oriented to person, place, and time.  ?Psychiatric:     ?   Attention and Perception: Attention and perception normal. She does not perceive auditory or visual hallucinations.     ?   Mood and Affect: Mood and affect normal.     ?   Speech: Speech normal.     ?   Behavior: Behavior normal. Behavior is cooperative.     ?   Thought Content: Thought content normal. Thought content is not paranoid or delusional. Thought content does not include homicidal or suicidal ideation.     ?   Cognition and Memory: Cognition and memory normal.     ?   Judgment: Judgment normal.  ? ?Review of Systems  ?Constitutional: Negative.   ?HENT: Negative.    ?Eyes: Negative.   ?Respiratory: Negative.    ?Cardiovascular: Negative.   ?Gastrointestinal: Negative.   ?Genitourinary: Negative.   ?Musculoskeletal: Negative.   ?Skin: Negative.   ?Neurological: Negative.   ?Endo/Heme/Allergies: Negative.   ?Psychiatric/Behavioral:  Positive for depression (On/off, good days/bad days). Negative for hallucinations, substance abuse and suicidal ideas (Denies at this time stating today is a good day.  More passive thoughts but have sat and thought about plans). The patient does not have insomnia. Nervous/anxious: Stable.  ?Blood pressure 140/80, pulse  74, temperature 98.2 ?F (36.8 ?C), temperature source Oral, resp. rate 19, last menstrual period 11/20/2006, SpO2 100 %. There is no height or weight on file to calculate BMI. ? ?Musculoskeletal: ?Strength & Muscle Tone: within normal limits ?Gait & Station: normal ?Patient leans: N/A ? ? ?[facility] MSE Discharge Disposition for Follow up and Recommendations: ?Based on my evaluation the patient does not appear to have an emergency medical condition and can be discharged with resources and follow up care in outpatient services for Medication Management, Group Therapy, and Intensive Outpatient Program ? ? ? Follow-up Information   ? ? [facility].   ?Specialty: Behavioral Health ?Why: Referral sent in for outpatient therapy.  If you don't hear anything by 10:00 tomorrow call the above number to set up services ?Contact information: ?[address] ?[place] [address] ?[phone] ? ?  ?  ? ?  ?  ? ?  ?  ? ?Alfio Loescher, NP ?02/01/2022, 1:09 PM ? ?

## 2022-02-01 NOTE — Telephone Encounter (Signed)
D:  Shuvon Rankin, NP referred pt to [facility].  A:  Placed call to pt, but there was no answer.  Left vm on pt's cell phone to call the case manager back.  Inform Shuvon. ?

## 2022-02-01 NOTE — ED Notes (Signed)
Patient discharge home. Patient belongings returned at discharge. Patient denies SI/HI and A/V/H.  ?

## 2022-02-01 NOTE — BH Assessment (Signed)
Pt to [facility] voluntarily at her daughter request to seek help about worsening depression and suicidal ideations. Pt denies SI, HI, AVH at this time, however, in November she had thoughts of jumping off a parking deck. Pt reports feelings of despair and uselessness. Pt reports "I have accomplished all I need to accomplish. I'm retired,I don't have no friends and it feels like I'm just existing." When asked what she would rather be doing than just "existing" pt states "nothing, I'm ready to go". When asked what would make her situation better pt states "friends, someone to talk too". Pt divorced in 2007 and retired in 2015-2016. Pt reports efforts to volunteer. Pt does not have outpt service, not hx of inpt treatment and no suicide attempts.  ? ?Pt is urgent   ?

## 2022-02-01 NOTE — Discharge Instructions (Addendum)
[place] Crisis Response Teams °Listed by counties in vicinity of [place] providers °[place] °[facility] 1-877-626-1772 °[place] °[facility] [phone] °[place] °[facility] [phone] °[place] °[facility] [phone] °[place] °               * [facility] 1-866-275-9552 °               * [facility] 1-800-939-5911 ° °[place] °[facility] 1-877-626-1772 °[place] °* [facility]  [phone] °* [facility] 1-800-939-5911  °

## 2022-02-02 ENCOUNTER — Telehealth (HOSPITAL_COMMUNITY): Payer: Self-pay | Admitting: Psychiatry

## 2022-02-15 ENCOUNTER — Telehealth (HOSPITAL_COMMUNITY): Payer: Self-pay | Admitting: Clinical

## 2022-02-21 ENCOUNTER — Ambulatory Visit (HOSPITAL_COMMUNITY): Payer: Medicare Other | Admitting: Clinical

## 2022-02-22 ENCOUNTER — Telehealth (HOSPITAL_COMMUNITY): Payer: Self-pay | Admitting: Internal Medicine

## 2022-02-22 NOTE — BH Assessment (Signed)
Care Management - [place] Follow Up Discharges  ? ?Writer attempted to make contact with patient today and was unsuccessful.  Writer left a HIPPA compliant voice message. ? ?Per chart review, pt was referred to [facility] ?

## 2022-02-27 ENCOUNTER — Telehealth (HOSPITAL_COMMUNITY): Payer: Self-pay | Admitting: Licensed Clinical Social Worker

## 2022-02-27 NOTE — Telephone Encounter (Signed)
The therapist receives an email from [facility] today at 10:55 a.m. EST with a signed copy of the therapist's Professional Disclosure Statement in advance of her appointment on 02/27/22 at 9 a.m. ? ?Myrna Blazer, MA, LCSW, [facility], LCAS ?02/27/2022 ?

## 2022-03-02 ENCOUNTER — Ambulatory Visit (INDEPENDENT_AMBULATORY_CARE_PROVIDER_SITE_OTHER): Payer: Medicare Other | Admitting: Licensed Clinical Social Worker

## 2022-03-02 DIAGNOSIS — F39 Unspecified mood [affective] disorder: Secondary | ICD-10-CM | POA: Diagnosis not present

## 2022-03-02 NOTE — Progress Notes (Signed)
Comprehensive Clinical Assessment (CCA) Note ? ?03/02/2022 ?Ashley Estes ?[id number] ? ?Chief Complaint:  ?Chief Complaint  ?Patient presents with  ? Depression  ? ?Virtual Visit via Video Note ?  ?I connected with Ashley Estes at 10 a.m. EST by a video enabled telemedicine application and verified that I am speaking with the correct person using two identifiers. ?  ?Location: ?Patient: home ?Provider: [facility] office ?  ?I discussed the limitations of evaluation and management by telemedicine and the availability of in person appointments. The patient expressed understanding and agreed to proceed. ?Ashley Estes says that she has been having bouts of depression X 6-7 years that started around 2016 before she moved to [place] from [place]. She describes herself as being ?introverted? and does a lot of things alone. She likes to read and do ?quiet things? pointing out that she takes ?no joy and happiness in birthdays and holidays.? She says that these bouts of depression occur once or twice a year lasting one or two days. Her most recent episode was around November of last year. She is unable to determine a precipitant for these episodes noting that she has even tried using a sad light; however, then Estes that they do not occur exclusively in the wintertime. She never had these episodes prior to 2016 saying that when she was still working that she ?did not have time? to dwell on anything. She retired at the age of 5 in 2013. During the most recent episode she says that she became ?forlorn? telling herself that her daughter is independent now and does not need her so ?what's the point? and ?why? is she ?here sucking up resources.? During these episodes, she Estes that she will not reach out for help and has ?scoped out? a particular parking deck which she says is ?high enough? from which to jump; however, has ?fortunately? never gone to the top of the parking deck. She says that she has told herself that  if she ever were to commit suicide that it would be important that she was somewhere that someone would find her as she does not want to be ?sitting dead in? her house and no one not know it.  ?She says that today's session with this therapist was a result of a therapy session that she had with her daughter who is presently attending [employer]. She says that her daughter wants their relationship to be different and believes that Ashley Estes has been ?too critical of her.? She admits that she is not physically affectionate with her daughter; however, her daughter has a physically affectionate relationship with her stepmother and once made a comment that she wanted her stepmother to help raise her children when she had them someday which was apparently upsetting to Ashley Estes. During this session, her daughter did tell Ashley Estes, ?I need you here? which Ashley Estes says was ?beneficial to hear her say that? such that now she believes she could definitely never act on these thoughts and wonders if she will now no longer have them. Ashley Estes that when she first had these thoughts around 2016 that they were ?minor thoughts? so were never significant enough that she would ever consider acting on them.  ?During the two to four days out of the year during which she is having these bouts, she thinks about not having a purpose and dwells on her ?shortcomings? such that she did not develop a single friend in college with whom she still talks. 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 She adds that she ?should have been? a straight A student but was not. She admits to being an ?extreme introvert? and does not let people close. She was married to her daughter's father for 14 years but Ashley Estes decided to end the marriage as she considered him more like a brother or friend than a husband and  says that he had ?control issues.? ?When asked if she plans on having additional family sessions with her daughter and her therapist, Ashley Estes says that she declined to have any more as they were too difficult so agree to this individual meeting with this therapist today.  ?She rates her current level of life satisfaction as a ?7? on a scale of 1 to 10 with 10 being most satisfied noting that what would increase it even more would be to have a couple of close friends. ? ?Visit Diagnosis: Unspecified Mood Disorder  ? ? ?CCA Screening, Triage and Referral (STR) ? ?Patient Reported Information ?How did you hear about [place]? Family/Friend ? ?Referral name: No data recorded ?Referral phone number: No data recorded ? ?Whom do you see for routine medical problems? No data recorded ?Practice/Facility Name: No data recorded ?Practice/Facility Phone Number: No data recorded ?Name of Contact: No data recorded ?Contact Number: No data recorded ?Contact Fax Number: No data recorded ?Prescriber Name: No data recorded ?Prescriber Address (if known): No data recorded ? ?What Is the Reason for Your Visit/Call Today? Pt reports feelings of despair, uselessness and thoughts/plans of commiting suicide. ? ?How Long Has This Been Causing You Problems? > than 6 months ? ?What Do You Feel Would Help You the Most Today? Treatment for Depression or other mood problem ? ? ?Have You Recently Been in Any Inpatient Treatment (Hospital/Detox/Crisis Center/28-Day Program)? No data recorded ?Name/Location of Program/Hospital:No data recorded ?How Long Were You There? No data recorded ?When Were You Discharged? No data recorded ? ?Have You Ever Received Services From [facility] Before? No data recorded ?Who Do You See at [facility]? No data recorded ? ?Have You Recently Had Any Thoughts About Hurting Yourself? No (reports having thoughts of suicide in November with plan to jump off parking deck [place]) ? ?Are You Planning to Commit  Suicide/Harm Yourself At This time? No ? ? ?Have you Recently Had Thoughts About Hurting Someone Karolee Ohs? No ? ?Explanation: No data recorded ? ?Have You Used Any Alcohol or Drugs in the Past 24 Hours? No ? ?How Long Ago Did You Use Drugs or Alcohol? No data recorded ?What Did You Use and How Much? No data recorded ? ?Do You Currently Have a Therapist/Psychiatrist? No data recorded ?Name of Therapist/Psychiatrist: No data recorded ? ?Have You Been Recently Discharged From Any Office Practice or Programs? No data recorded ?Explanation of Discharge From Practice/Program: No data recorded ? ?  ?CCA Screening Triage Referral Assessment ?Type of Contact: No data recorded ?Is this Initial or Reassessment? No data recorded ?Date Telepsych consult ordered in [facility]:  No data recorded ?Time Telepsych consult ordered in [facility]:  No data recorded ? ?Patient Reported Information Reviewed? No data recorded ?Patient Left Without Being Seen? No data recorded ?Reason for Not Completing Assessment: No data recorded ? ?Collateral Involvement: No data recorded ? ?Does Patient Have a Automotive engineer Guardian? No data recorded ?Name and Contact of Legal Guardian: No data  recorded ?If Minor and Not Living with Parent(s), Who has Custody? No data recorded ?Is CPS involved or ever been involved? No data recorded ?Is APS involved or ever been involved? No data recorded ? ?Patient Determined To Be At Risk for Harm To Self or Others Based on Review of Patient Reported Information or Presenting Complaint? No data recorded ?Method: No data recorded ?Availability of Means: No data recorded ?Intent: No data recorded ?Notification Required: No data recorded ?Additional Information for Danger to Others Potential: No data recorded ?Additional Comments for Danger to Others Potential: No data recorded ?Are There Guns or Other Weapons in Your Home? No data recorded ?Types of Guns/Weapons: No data recorded ?Are These Weapons Safely Secured?                             No data recorded ?Who Could Verify You Are Able To Have These Secured: No data recorded ?Do You Have any Outstanding Charges, Pending Court Dates, Parole/Probation? No data recorded ?Contacted To I

## 2022-03-18 ENCOUNTER — Encounter (HOSPITAL_COMMUNITY): Payer: Self-pay | Admitting: Psychiatry

## 2022-03-18 ENCOUNTER — Ambulatory Visit (HOSPITAL_BASED_OUTPATIENT_CLINIC_OR_DEPARTMENT_OTHER): Payer: Medicare Other | Admitting: Psychiatry

## 2022-03-18 VITALS — Wt 121.0 lb

## 2022-03-18 DIAGNOSIS — F39 Unspecified mood [affective] disorder: Secondary | ICD-10-CM

## 2022-03-18 NOTE — Progress Notes (Signed)
?[name] ?Initial Assessment Note ? ?Patient Location:Home ?Provider Location:Home office ? ? ?I connected with Ashley Estes by video and verified that I am talking with correct person using two identifiers.  ? ?I discussed the limitations, risks, security and privacy concerns of performing an evaluation and management service virtually and the availability of in person appointments. I also discussed with the patient that there may be a patient responsible charge related to this service. The patient expressed understanding and agreed to proceed. ? ?Ashley Estes ?[id number] ?66 y.o. ? ?03/18/2022 ?11:06 AM ? ?Chief Complaint:  ?I was referred from emergency room. ? ?History of Present Illness:  ? ? ?Past Psychiatric History: ?Patient is a 66 year old Caucasian, retired, divorced female who is referred from behavior health urgent care.  She was evaluated on March 15 as a voluntary walk-in with complaints of depression and having suicidal ideation.  She wanted to jump from the deck.  She was encouraged by her daughter to be evaluated.  Patient told once in a while she do get these strong feeling about hopelessness and thoughts of ending her life but usually she ignored.  This time she did talk to her daughter who lives in [place] and she encouraged to seek help.  Patient not sure what triggered these thoughts but described her lifestyle is very simple and quite.  She had accomplished all big goals in her life and sometimes she does not see the purpose of living.  She lived by herself, very quiet, limited friends and she is very comfortable with her lifestyle.  She denies crying spells on a daily basis.  She also denies any suicidal thoughts on a daily basis.  She sleeps good.  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  She reported that was her social life and since she retired she never made any friends and decided to move to [place] due to better and better  affordability.  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  She enjoys reading and she does feel good about it.  She denies any paranoia, hallucination, anger, irritability, mania, significant weight loss or weight gain.  She also reported these suicidal thoughts are not on a regular basis and usually she gets twice a year.  This is the first time she talked to her daughter got concerned.  Patient has siblings but they live out of state.  She is divorced after married for many years because she felt living with her brother.  Patient is still had contact with her ex who remarried.  Patient denies any crying spells or any feeling of hopelessness.  She denies any anhedonia, irritability, severe mood swings, nightmares, flashback.  She feels good when her daughter told that she needs her.  Usually she see her daughter twice in a year on holidays.  She admitted sometimes she does feel that she needed her own space and does not want to bother by other people.  She had simplified her life and does not want any new stressors, drama or challenges.  She admitted that she is introvert and that is her lifestyle.  She admitted these suicidal thoughts are sometime not pleasant and she usually feel in a dark hole but in the past she had ignored them and never acted on it.  She started therapy with Aldean Jewett.  She does not like to be around people because she feels okay by walking alone, eating alone and enjoying  her life alone.  She is healthy and reported taking care of good health.  Currently she is not on any antidepressant and does not like to be on any antidepressant.  Patient appears pleasant, coherent, does not appear to be in distress or responding to internal stimuli.  She is not drinking or using any illegal substances.  She has headaches but no history of head trauma, seizures. ? ? ?Family History  ?Problem Relation Age of Onset  ? Cancer Mother   ?     Dec from lung Ca age 20  ? Breast cancer Mother 10  ?  Thyroid disease Mother   ? Ovarian cancer Mother   ?     carcinoma in situ found on TAH-BAO at 62  ? Diabetes Father   ?     d. from complications of diabetes at 23  ? Cancer Father   ?     lung cancer - metastatic  ? Migraines Sister   ? Cancer Sister   ?     skin cancer around the anus  ? Breast cancer Maternal Aunt   ?     d. 64  ? Dementia Paternal Aunt   ? Other Paternal Uncle   ?     both uncles died in their 25s  ? Dementia Maternal Grandmother   ? Other Paternal Grandmother   ?     d. in childbirth  ? Other Paternal Grandfather   ?     d. of ear infection  ? Breast cancer Other   ?     MGMs sister  ?  ? ? ?Past Medical History:  ?Diagnosis Date  ? Bursitis of shoulder   ? Family history of breast cancer   ? Family history of cancer   ? Hypercholesterolemia   ? Migraines   ? Osteoporosis   ? Periodontal disease   ? Vestibular neuritis 03/31/2021  ? ? ? ?Traumatic Head Injury: ?Denies any history of head trauma. ? ?Work History; ?Patient currently retired since 2013.  She had worked in same company since 1985.  She does a lot of volunteer work at [place], [employer] and other places.   ? ?Psychosocial History; ?Patient born and raised in [place].  Her parents are deceased.  She has siblings who are in [place], [place] and [place].  She married and known to her husband since 68.  She got divorced in 2007 after felt that she is living with him like a brother.  She never remarried however her husband remarried.  Together they have a 22 year old daughter who lives in [place].  She does talk to her daughter 3 times a month.   ? ?Legal History; ?Patient denies any legal history. ? ?History Of Abuse; ?Patient denies any history of abuse. ? ?Substance Abuse History; ?Patient denies any history of substance use. ? ?Neurologic: ?Headache: Yes ?Seizure: No ?Paresthesias: No ? ? ?Outpatient Encounter Medications as of 03/18/2022  ?Medication Sig  ? calcium carbonate (OS-CAL) 600 MG TABS  tablet Take 600 mg by mouth 2 (two) times daily with a meal.  ? Multiple Vitamins-Minerals (MULTIVITAMIN PO) Take 1 tablet by mouth daily.  ? OVER THE COUNTER MEDICATION Take 1 tablet by mouth daily as needed (constipation). [facility] laxative tablet - Patient don't know the name  ? pravastatin (PRAVACHOL) 20 MG tablet Take 20 mg by mouth daily.  ? QULIPTA 30 MG TABS Take 1 tablet by mouth daily.  ? rizatriptan (MAXALT) 10 MG tablet Take  10 mg by mouth daily as needed for migraine.  ? ?No facility-administered encounter medications on file as of 03/18/2022.  ? ? ?No results found for this or any previous visit (from the past 2160 hour(s)). ? ? ? ?Constitutional: ? LMP 11/20/2006 (Approximate)  ? ? ?Musculoskeletal: ?Strength & Muscle Tone: within normal limits ?Gait & Station: normal ?Patient leans: N/A ? ?Psychiatric Specialty Exam: ?Physical Exam  ?ROS  ?Weight 121 lb (54.9 kg), last menstrual period 11/20/2006.There is no height or weight on file to calculate BMI.  ?General Appearance: Well Groomed  ?Eye Contact:  Good  ?Speech:  Normal Rate  ?Volume:  Normal  ?Mood:  Euthymic  ?Affect:  Congruent  ?Thought Process:  Goal Directed  ?Orientation:  Full (Time, Place, and Person)  ?Thought Content:  Logical  ?Suicidal Thoughts:  No  ?Homicidal Thoughts:  No  ?Memory:  Immediate;   Good ?Recent;   Good ?Remote;   Good  ?Judgement:  Good  ?Insight:  Good  ?Psychomotor Activity:  Normal  ?Concentration:  Concentration: Good and Attention Span: Good  ?Recall:  Good  ?Fund of Knowledge:  Good  ?Language:  Good  ?Akathisia:  No  ?Handed:  Right  ?AIMS (if indicated):     ?Assets:  Communication Skills ?Desire for Improvement ?Housing ?Talents/Skills ?Transportation  ?ADL's:  Intact  ?Cognition:  WNL  ?Sleep:     ? ? ? ?Assessment/Plan:  ?Dondra is 66 year old Caucasian, retired, divorced female who is referred from behavioral health urgent care.  At that time she was having suicidal thoughts to jump from a parking deck.  We  had a long discussion about these thoughts which she usually described twice a year and she is able to ignore them but this time she had talked to her daughter who encouraged to seek evaluation.  We tal

## 2022-03-23 ENCOUNTER — Other Ambulatory Visit: Payer: Self-pay | Admitting: Family Medicine

## 2022-03-23 DIAGNOSIS — M8588 Other specified disorders of bone density and structure, other site: Secondary | ICD-10-CM

## 2022-05-01 ENCOUNTER — Telehealth (HOSPITAL_COMMUNITY): Payer: Medicare Other | Admitting: Psychiatry

## 2022-07-12 DIAGNOSIS — Z1231 Encounter for screening mammogram for malignant neoplasm of breast: Secondary | ICD-10-CM

## 2022-09-15 ENCOUNTER — Ambulatory Visit
Admission: RE | Admit: 2022-09-15 | Discharge: 2022-09-15 | Disposition: A | Discharge status: Home / Self Care | Payer: Medicare Other | Source: Ambulatory Visit | Attending: Family Medicine | Admitting: Family Medicine

## 2022-09-15 DIAGNOSIS — M8588 Other specified disorders of bone density and structure, other site: Secondary | ICD-10-CM

## 2022-10-02 ENCOUNTER — Other Ambulatory Visit: Payer: Self-pay | Admitting: Family Medicine

## 2022-10-02 DIAGNOSIS — Z1231 Encounter for screening mammogram for malignant neoplasm of breast: Secondary | ICD-10-CM

## 2022-10-06 ENCOUNTER — Ambulatory Visit
Admission: RE | Admit: 2022-10-06 | Discharge: 2022-10-06 | Disposition: A | Discharge status: Home / Self Care | Payer: Medicare Other | Source: Ambulatory Visit

## 2022-10-06 DIAGNOSIS — Z1231 Encounter for screening mammogram for malignant neoplasm of breast: Secondary | ICD-10-CM

## 2023-06-05 ENCOUNTER — Other Ambulatory Visit: Payer: Self-pay

## 2023-06-05 ENCOUNTER — Emergency Department (HOSPITAL_COMMUNITY): Payer: Medicare Other

## 2023-06-05 ENCOUNTER — Emergency Department (HOSPITAL_COMMUNITY)
Admission: EM | Admit: 2023-06-05 | Discharge: 2023-06-05 | Disposition: A | Discharge status: Home / Self Care | Payer: Medicare Other | Attending: Emergency Medicine | Admitting: Emergency Medicine

## 2023-06-05 DIAGNOSIS — K5730 Diverticulosis of large intestine without perforation or abscess without bleeding: Secondary | ICD-10-CM | POA: Insufficient documentation

## 2023-06-05 DIAGNOSIS — N23 Unspecified renal colic: Secondary | ICD-10-CM

## 2023-06-05 DIAGNOSIS — N132 Hydronephrosis with renal and ureteral calculous obstruction: Secondary | ICD-10-CM | POA: Insufficient documentation

## 2023-06-05 DIAGNOSIS — R109 Unspecified abdominal pain: Secondary | ICD-10-CM | POA: Diagnosis present

## 2023-06-05 LAB — URINALYSIS, ROUTINE W REFLEX MICROSCOPIC
Bilirubin Urine: NEGATIVE
Glucose, UA: NEGATIVE mg/dL
Ketones, ur: 5 mg/dL — AB
Leukocytes,Ua: NEGATIVE
Nitrite: NEGATIVE
Protein, ur: 30 mg/dL — AB
RBC / HPF: >50 RBC/hpf (ref 0–5)
Specific Gravity, Urine: 1.016 (ref 1.005–1.030)
pH: 5 (ref 5.0–8.0)

## 2023-06-05 MED ORDER — HYDROMORPHONE HCL 1 MG/ML IJ SOLN
0.5000 mg | Freq: Once | INTRAMUSCULAR | Status: AC
  Administered 2023-06-05: 0.5 mg via INTRAVENOUS
  Filled 2023-06-05: qty 1

## 2023-06-05 MED ORDER — ONDANSETRON HCL 4 MG/2ML IJ SOLN
4.0000 mg | Freq: Once | INTRAMUSCULAR | Status: AC
  Administered 2023-06-05: 4 mg via INTRAVENOUS
  Filled 2023-06-05: qty 2

## 2023-06-05 MED ORDER — METOCLOPRAMIDE HCL 5 MG/ML IJ SOLN
5.0000 mg | Freq: Once | INTRAMUSCULAR | Status: AC
  Administered 2023-06-05: 5 mg via INTRAVENOUS
  Filled 2023-06-05: qty 2

## 2023-06-05 MED ORDER — OXYCODONE-ACETAMINOPHEN 5-325 MG PO TABS: 1.0000 | ORAL_TABLET | Freq: Four times a day (QID) | ORAL | 0 refills | Status: DC | PRN

## 2023-06-05 MED ORDER — DIPHENHYDRAMINE HCL 50 MG/ML IJ SOLN
12.5000 mg | Freq: Once | INTRAMUSCULAR | Status: AC
  Administered 2023-06-05: 12.5 mg via INTRAVENOUS
  Filled 2023-06-05: qty 1

## 2023-06-05 MED ORDER — ONDANSETRON 8 MG PO TBDP: 8.0000 mg | ORAL_TABLET | Freq: Three times a day (TID) | ORAL | 0 refills | Status: DC | PRN

## 2023-06-05 MED ORDER — SODIUM CHLORIDE 0.9 % IV SOLN: INTRAVENOUS | Status: DC

## 2023-06-05 NOTE — ED Triage Notes (Signed)
Pt reports right flank pain that radiates to right back since this morning.

## 2023-06-05 NOTE — ED Notes (Addendum)
Patient transported to CT 

## 2023-06-05 NOTE — ED Provider Notes (Signed)
[place] EMERGENCY DEPARTMENT AT [facility] Provider Note   CSN: [id number] Arrival date & time: 06/05/23  0801     History  Chief Complaint  Patient presents with   Flank Pain    Ashley Estes is a 67 y.o. female.  67 year old female presents with acute onset of right-sided flank pain which began this morning.  Patient denies any history of kidney stones.  No urinary changes.  Pain radiates to her right groin.  Hard to find position of comfort.  No treatment use prior to arrival       Home Medications Prior to Admission medications   Medication Sig Start Date End Date Taking? Authorizing Provider  calcium carbonate (OS-CAL) 600 MG TABS tablet Take 600 mg by mouth 2 (two) times daily with a meal.    [provider]  Multiple Vitamins-Minerals (MULTIVITAMIN PO) Take 1 tablet by mouth daily.    [provider]  OVER THE COUNTER MEDICATION Take 1 tablet by mouth daily as needed (constipation). [facility]S laxative tablet - Patient don't know the name    [provider]  pravastatin (PRAVACHOL) 20 MG tablet Take 20 mg by mouth daily.    [provider]  QULIPTA 30 MG TABS Take 1 tablet by mouth daily. 05/24/21   [provider]  rizatriptan (MAXALT) 10 MG tablet Take 10 mg by mouth daily as needed for migraine. 03/02/15   [provider]      Allergies    Patient has no known allergies.    Review of Systems   Review of Systems  All other systems reviewed and are negative.   Physical Exam Updated Vital Signs BP 139/85 (BP Location: Left Arm)   Pulse 64   Temp 97.9 F (36.6 C)   Ht 1.626 m (5\' 4" )   Wt 54.4 kg   LMP 11/20/2006 (Approximate)   SpO2 100%   BMI 20.60 kg/m  Physical Exam Vitals and nursing note reviewed.  Constitutional:      General: She is not in acute distress.    Appearance: Normal appearance. She is well-developed. She is not toxic-appearing.  HENT:     Head: Normocephalic and atraumatic.   Eyes:     General: Lids are normal.     Conjunctiva/sclera: Conjunctivae normal.     Pupils: Pupils are equal, round, and reactive to light.  Neck:     Thyroid: No thyroid mass.     Trachea: No tracheal deviation.  Cardiovascular:     Rate and Rhythm: Normal rate and regular rhythm.     Heart sounds: Normal heart sounds. No murmur heard.    No gallop.  Pulmonary:     Effort: Pulmonary effort is normal. No respiratory distress.     Breath sounds: Normal breath sounds. No stridor. No decreased breath sounds, wheezing, rhonchi or rales.  Abdominal:     General: There is no distension.     Palpations: Abdomen is soft.     Tenderness: There is no abdominal tenderness. There is right CVA tenderness. There is no rebound.  Musculoskeletal:        General: No tenderness. Normal range of motion.     Cervical back: Normal range of motion and neck supple.  Skin:    General: Skin is warm and dry.     Findings: No abrasion or rash.  Neurological:     Mental Status: She is alert and oriented to person, place, and time. Mental status is at baseline.  GCS: GCS eye subscore is 4. GCS verbal subscore is 5. GCS motor subscore is 6.     Cranial Nerves: Cranial nerves are intact. No cranial nerve deficit.     Sensory: No sensory deficit.     Motor: Motor function is intact.  Psychiatric:        Attention and Perception: Attention normal.        Speech: Speech normal.        Behavior: Behavior normal.     ED Results / Procedures / Treatments   Labs (all labs ordered are listed, but only abnormal results are displayed) Labs Reviewed  URINALYSIS, ROUTINE W REFLEX MICROSCOPIC    EKG None  Radiology No results found.  Procedures Procedures    Medications Ordered in ED Medications  0.9 %  sodium chloride infusion (has no administration in time range)  HYDROmorphone (DILAUDID) injection 0.5 mg (has no administration in time range)  ondansetron (ZOFRAN) injection 4 mg (has no  administration in time range)    ED Course/ Medical Decision Making/ A&P                             Medical Decision Making Amount and/or Complexity of Data Reviewed Labs: ordered. Radiology: ordered.  Risk Prescription drug management.   Patient medicated for suspected kidney stone and does feel slightly better.  Urinalysis shows hematuria without evidence of infection.  Renal CT per my review interpretation shows possible kidney stone which may or may have passed.  Did have some emesis here that was controlled with antiemetics.  Feels better at this time.  Will be discharged home and given urology follow-up        Final Clinical Impression(s) / ED Diagnoses Final diagnoses:  None    Rx / DC Orders ED Discharge Orders     None         Lorre Nick, MD 06/05/23 1237

## 2023-06-25 ENCOUNTER — Telehealth: Payer: Self-pay | Admitting: Neurology

## 2023-06-25 NOTE — Telephone Encounter (Signed)
Patient left a voicemail on my phone stating she had a HST in 2021 but never got the CPAP filled. She was wondering if she could get it filled now or does she need to do a new HST?

## 2023-07-10 NOTE — Progress Notes (Signed)
67 y.o. [id number] Single Caucasian female here for annual exam.    Had a kidney stone and passed it on her own.  Seeing urology.   Did have a bladder infection.   Had Covid earlier in the year.   No vaginal bleeding.  No problems with bladder or bowel control.  Having some changes in her tips of her finger nails.   Not sexually active.   Going to [place].   PCP - Dr. Wynelle Link  Patient's last menstrual period was 11/20/2006 (approximate).           Sexually active: No.  The current method of family planning is post menopausal status.    Exercising: Yes.     walking Smoker:  no  Health Maintenance: Pap:  02/11/18 neg: HR HPV neg History of abnormal Pap:  no MMG:  10/06/22 Breast Density Cat C, BI-RADS CAT 1 neg Colonoscopy:  done July 2024, diverticulosis - due in 7 years.  Stool sample with PCP. BMD:   09/15/22  Result  osteopenic TDaP:  PCP Gardasil:   no HIV: donates blood Hep C: donates blood Screening Labs:  PCP   reports that she has never smoked. She has never used smokeless tobacco. She reports that she does not drink alcohol and does not use drugs.  Past Medical History:  Diagnosis Date   Bursitis of shoulder    Family history of breast cancer    Family history of cancer    Hypercholesterolemia    Migraines    Osteoporosis    Periodontal disease    Vestibular neuritis 03/31/2021    History reviewed. No pertinent surgical history.  Current Outpatient Medications  Medication Sig Dispense Refill   BISACODYL 5 MG EC tablet See admin instructions.     calcium carbonate (OS-CAL) 600 MG TABS tablet Take 600 mg by mouth 2 (two) times daily with a meal.     Multiple Vitamins-Minerals (MULTIVITAMIN PO) Take 1 tablet by mouth daily.     OVER THE COUNTER MEDICATION Take 1 tablet by mouth daily as needed (constipation). CVS laxative tablet - Patient don't know the name     pravastatin (PRAVACHOL) 20 MG tablet Take 20 mg by mouth daily.     rizatriptan (MAXALT) 10 MG  tablet Take 10 mg by mouth daily as needed for migraine.     No current facility-administered medications for this visit.    Family History  Problem Relation Age of Onset   Cancer Mother        Dec from lung Ca age 37   Breast cancer Mother 67   Thyroid disease Mother    Ovarian cancer Mother        carcinoma in situ found on TAH-BAO at 56   Diabetes Father        d. from complications of diabetes at 82   Cancer Father        lung cancer - metastatic   Migraines Sister    Cancer Sister        skin cancer around the anus   Breast cancer Maternal Aunt        d. 46   Dementia Paternal Aunt    Other Paternal Uncle        both uncles died in their 14s   Dementia Maternal Grandmother    Other Paternal Grandmother        d. in childbirth   Other Paternal Grandfather        d. of ear infection  Review of Systems  All other systems reviewed and are negative.   Exam:   BP 118/72 (BP Location: Left Arm, Patient Position: Sitting, Cuff Size: Normal)   Pulse 64   Ht 5\' 5"  (1.651 m)   Wt 121 lb (54.9 kg)   LMP 11/20/2006 (Approximate)   SpO2 99%   BMI 20.14 kg/m     General appearance: alert, cooperative and appears stated age Head: normocephalic, without obvious abnormality, atraumatic Neck: no adenopathy, supple, symmetrical, trachea midline and thyroid normal to inspection and palpation Lungs: clear to auscultation bilaterally Breasts: normal appearance, no masses or tenderness, No nipple retraction or dimpling, No nipple discharge or bleeding, No axillary adenopathy Heart: regular rate and rhythm Abdomen: soft, non-tender; no masses, no organomegaly Extremities: extremities normal, atraumatic, no cyanosis or edema Skin: skin color, texture, turgor normal. No rashes or lesions Lymph nodes: cervical, supraclavicular, and axillary nodes normal. Neurologic: grossly normal  Pelvic: External genitalia:  no lesions              No abnormal inguinal nodes palpated.               Urethra:  normal appearing urethra with no masses, tenderness or lesions              Bartholins and Skenes: normal                 Vagina: normal appearing vagina with normal color and discharge, no lesions              Cervix: no lesions              Pap taken: yes Bimanual Exam:  Uterus:  normal size, contour, position, consistency, mobility, non-tender              Adnexa: no mass, fullness, tenderness              Rectal exam: yes.  Confirms.              Anus:  normal sphincter tone, no lesions  Chaperone was present for exam:  Warren Lacy, CMA  Assessment:   Encounter for breast and pelvic exam.  Cervical cancer screening.  FH breast cancer.  Premenopausal. FH ovarian cancer in mother at time of her hysterectomy. Osteoporosis.  Off Fosamax for about 4 years.  Renal stone.  Doing work up.   Plan: Mammogram screening discussed. Self breast awareness reviewed. Pap and HR HPV collected. Guidelines for Calcium, Vitamin D, regular exercise program including cardiovascular and weight bearing exercise. BMD due in 2025.   Consider MVI with biotin.  Follow up in 2 years and prn.

## 2023-07-24 ENCOUNTER — Other Ambulatory Visit (HOSPITAL_COMMUNITY)
Admission: RE | Admit: 2023-07-24 | Discharge: 2023-07-24 | Disposition: A | Discharge status: Home / Self Care | Payer: Medicare Other | Source: Ambulatory Visit | Attending: Obstetrics and Gynecology | Admitting: Obstetrics and Gynecology

## 2023-07-24 ENCOUNTER — Encounter: Payer: Self-pay | Admitting: Obstetrics and Gynecology

## 2023-07-24 ENCOUNTER — Ambulatory Visit (INDEPENDENT_AMBULATORY_CARE_PROVIDER_SITE_OTHER): Payer: Medicare Other | Admitting: Obstetrics and Gynecology

## 2023-07-24 VITALS — BP 118/72 | HR 64 | Ht 65.0 in | Wt 121.0 lb

## 2023-07-24 DIAGNOSIS — Z9189 Other specified personal risk factors, not elsewhere classified: Secondary | ICD-10-CM | POA: Diagnosis not present

## 2023-07-24 DIAGNOSIS — N200 Calculus of kidney: Secondary | ICD-10-CM | POA: Insufficient documentation

## 2023-07-24 DIAGNOSIS — Z01419 Encounter for gynecological examination (general) (routine) without abnormal findings: Secondary | ICD-10-CM

## 2023-07-24 DIAGNOSIS — Z1151 Encounter for screening for human papillomavirus (HPV): Secondary | ICD-10-CM | POA: Insufficient documentation

## 2023-07-24 DIAGNOSIS — M810 Age-related osteoporosis without current pathological fracture: Secondary | ICD-10-CM

## 2023-07-24 DIAGNOSIS — Z124 Encounter for screening for malignant neoplasm of cervix: Secondary | ICD-10-CM | POA: Diagnosis not present

## 2023-07-24 NOTE — Patient Instructions (Signed)

## 2023-07-26 LAB — CYTOLOGY - PAP
Comment: NEGATIVE
Diagnosis: NEGATIVE
High risk HPV: NEGATIVE

## 2023-08-24 ENCOUNTER — Other Ambulatory Visit: Payer: Self-pay | Admitting: Family Medicine

## 2023-08-24 DIAGNOSIS — Z1231 Encounter for screening mammogram for malignant neoplasm of breast: Secondary | ICD-10-CM

## 2023-09-23 ENCOUNTER — Other Ambulatory Visit: Payer: Self-pay | Admitting: Medical Genetics

## 2023-09-23 DIAGNOSIS — Z006 Encounter for examination for normal comparison and control in clinical research program: Secondary | ICD-10-CM

## 2023-10-22 ENCOUNTER — Ambulatory Visit
Admission: RE | Admit: 2023-10-22 | Discharge: 2023-10-22 | Disposition: A | Discharge status: Home / Self Care | Payer: Medicare Other | Source: Ambulatory Visit | Attending: Family Medicine | Admitting: Family Medicine

## 2023-10-22 DIAGNOSIS — Z1231 Encounter for screening mammogram for malignant neoplasm of breast: Secondary | ICD-10-CM

## 2024-05-01 ENCOUNTER — Other Ambulatory Visit (HOSPITAL_COMMUNITY)
Admission: RE | Admit: 2024-05-01 | Discharge: 2024-05-01 | Disposition: A | Discharge status: Home / Self Care | Payer: Self-pay | Source: Ambulatory Visit | Attending: Medical Genetics | Admitting: Medical Genetics

## 2024-05-01 DIAGNOSIS — Z006 Encounter for examination for normal comparison and control in clinical research program: Secondary | ICD-10-CM | POA: Insufficient documentation

## 2024-05-02 ENCOUNTER — Other Ambulatory Visit (HOSPITAL_BASED_OUTPATIENT_CLINIC_OR_DEPARTMENT_OTHER): Payer: Self-pay | Admitting: Family Medicine

## 2024-05-02 DIAGNOSIS — M810 Age-related osteoporosis without current pathological fracture: Secondary | ICD-10-CM

## 2024-05-10 LAB — GENECONNECT MOLECULAR SCREEN: Genetic Analysis Overall Interpretation: NEGATIVE

## 2024-07-31 ENCOUNTER — Other Ambulatory Visit (HOSPITAL_COMMUNITY): Payer: Self-pay

## 2024-07-31 MED ORDER — FLUZONE HIGH-DOSE 0.5 ML IM SUSY
0.5000 mL | PREFILLED_SYRINGE | Freq: Once | INTRAMUSCULAR | 0 refills | Status: AC
  Filled 2024-07-31: qty 0.5, 1d supply, fill #0

## 2024-10-10 ENCOUNTER — Other Ambulatory Visit: Payer: Self-pay | Admitting: Family Medicine

## 2024-10-10 DIAGNOSIS — Z1231 Encounter for screening mammogram for malignant neoplasm of breast: Secondary | ICD-10-CM

## 2024-11-06 ENCOUNTER — Inpatient Hospital Stay: Admission: RE | Admit: 2024-11-06

## 2024-11-06 DIAGNOSIS — Z1231 Encounter for screening mammogram for malignant neoplasm of breast: Secondary | ICD-10-CM

## 2024-12-22 ENCOUNTER — Other Ambulatory Visit (HOSPITAL_BASED_OUTPATIENT_CLINIC_OR_DEPARTMENT_OTHER)

## 2025-01-01 ENCOUNTER — Other Ambulatory Visit (HOSPITAL_BASED_OUTPATIENT_CLINIC_OR_DEPARTMENT_OTHER)
